# Patient Record
Sex: Female | Born: 1975 | Race: White | Hispanic: No | Marital: Married | Smoking: Former smoker
Health system: Southern US, Community
[De-identification: ages and names within clinical notes are randomized; demographics above are authoritative.]

## PROBLEM LIST (undated history)

## (undated) ENCOUNTER — Inpatient Hospital Stay (HOSPITAL_COMMUNITY): Payer: Self-pay

## (undated) HISTORY — PX: TONSILLECTOMY: SUR1361

---

## 1997-06-26 ENCOUNTER — Emergency Department (HOSPITAL_COMMUNITY): Admission: EM | Admit: 1997-06-26 | Discharge: 1997-06-26 | Payer: Self-pay | Admitting: Emergency Medicine

## 1997-08-31 ENCOUNTER — Emergency Department (HOSPITAL_COMMUNITY): Admission: EM | Admit: 1997-08-31 | Discharge: 1997-08-31 | Payer: Self-pay | Admitting: Emergency Medicine

## 2002-11-16 ENCOUNTER — Other Ambulatory Visit: Admission: RE | Admit: 2002-11-16 | Discharge: 2002-11-16 | Payer: Self-pay | Admitting: Obstetrics and Gynecology

## 2003-11-01 ENCOUNTER — Other Ambulatory Visit: Admission: RE | Admit: 2003-11-01 | Discharge: 2003-11-01 | Payer: Self-pay | Admitting: Obstetrics and Gynecology

## 2004-12-31 ENCOUNTER — Other Ambulatory Visit: Admission: RE | Admit: 2004-12-31 | Discharge: 2004-12-31 | Payer: Self-pay | Admitting: Obstetrics and Gynecology

## 2007-11-11 ENCOUNTER — Encounter: Admission: RE | Admit: 2007-11-11 | Discharge: 2007-11-11 | Payer: Self-pay | Admitting: Internal Medicine

## 2009-06-11 ENCOUNTER — Ambulatory Visit: Payer: Self-pay | Admitting: Physician Assistant

## 2009-06-11 ENCOUNTER — Inpatient Hospital Stay (HOSPITAL_COMMUNITY): Admission: AD | Admit: 2009-06-11 | Discharge: 2009-06-11 | Payer: Self-pay | Admitting: Obstetrics and Gynecology

## 2009-08-06 ENCOUNTER — Inpatient Hospital Stay (HOSPITAL_COMMUNITY): Admission: AD | Admit: 2009-08-06 | Discharge: 2009-08-08 | Payer: Self-pay | Admitting: *Deleted

## 2010-05-28 LAB — CBC
HCT: 32.7 % — ABNORMAL LOW (ref 36.0–46.0)
MCHC: 34.8 g/dL (ref 30.0–36.0)
MCV: 98.2 fL (ref 78.0–100.0)
Platelets: 197 10*3/uL (ref 150–400)
RDW: 12.7 % (ref 11.5–15.5)
RDW: 13.5 % (ref 11.5–15.5)
WBC: 16 10*3/uL — ABNORMAL HIGH (ref 4.0–10.5)
WBC: 17.2 10*3/uL — ABNORMAL HIGH (ref 4.0–10.5)

## 2010-05-28 LAB — RPR: RPR Ser Ql: NONREACTIVE

## 2010-05-30 LAB — URINALYSIS, ROUTINE W REFLEX MICROSCOPIC
Bilirubin Urine: NEGATIVE
Ketones, ur: NEGATIVE mg/dL
Nitrite: NEGATIVE
Specific Gravity, Urine: <1.005 — ABNORMAL LOW (ref 1.005–1.030)
Urobilinogen, UA: 0.2 mg/dL (ref 0.0–1.0)
pH: 5.5 (ref 5.0–8.0)

## 2010-05-30 LAB — URINE MICROSCOPIC-ADD ON

## 2011-02-04 LAB — OB RESULTS CONSOLE HEPATITIS B SURFACE ANTIGEN: Hepatitis B Surface Ag: NEGATIVE

## 2011-02-04 LAB — OB RESULTS CONSOLE GC/CHLAMYDIA: Chlamydia: NEGATIVE

## 2011-02-04 LAB — OB RESULTS CONSOLE RUBELLA ANTIBODY, IGM: Rubella: IMMUNE

## 2011-02-04 LAB — OB RESULTS CONSOLE RPR: RPR: NONREACTIVE

## 2011-02-04 LAB — OB RESULTS CONSOLE ABO/RH: RH Type: POSITIVE

## 2011-03-12 NOTE — L&D Delivery Note (Signed)
Pt continued to have decels and a low baseline with pushing. She brought the baby to +3 station. At that point the [facility] was in the 90's for 5 minutes. The VE was placed and the patient delivered one live viable white female infant over 2nd degree mid line tear in the ROP  Position. NICU was present. Placenta S/I. EBL-400cc.  Baby to NBN. Triple nuchal cord. Baby delivered with one push, no pop offs.

## 2011-08-05 ENCOUNTER — Inpatient Hospital Stay (HOSPITAL_COMMUNITY)
Admission: AD | Admit: 2011-08-05 | Discharge: 2011-08-05 | Disposition: A | Discharge status: Hospice | Payer: BC Managed Care – PPO | Source: Ambulatory Visit | Attending: Obstetrics and Gynecology | Admitting: Obstetrics and Gynecology

## 2011-08-05 ENCOUNTER — Encounter (HOSPITAL_COMMUNITY): Payer: Self-pay | Admitting: *Deleted

## 2011-08-05 ENCOUNTER — Inpatient Hospital Stay (HOSPITAL_COMMUNITY): Payer: BC Managed Care – PPO

## 2011-08-05 DIAGNOSIS — O368190 Decreased fetal movements, unspecified trimester, not applicable or unspecified: Secondary | ICD-10-CM | POA: Insufficient documentation

## 2011-08-05 NOTE — Discharge Instructions (Signed)

## 2011-08-05 NOTE — MAU Note (Signed)
Pt hasn't felt the baby moving as much today and she called the office and was told to come in.  Denies any pain, bleeding or leaking.

## 2011-08-05 NOTE — MAU Note (Signed)
A lot less movement this afternoon.denies pain or bleeding.

## 2011-08-05 NOTE — MAU Provider Note (Signed)
Krista Mcintosh is a 36 y.o. female @ [redacted]w[redacted]d gestation who presents to MAU for decreased fetal movement.   BP 126/66  Pulse 77  Resp 18  EFM Baseline 120, non reactive tracing.   I spoke with Dr. Dareen Piano and he request AFI and BPP.   RN to call with results.  Medical screening exam complete and patient stable to await further evaluation from Dr. Dareen Piano.

## 2011-08-11 ENCOUNTER — Inpatient Hospital Stay (HOSPITAL_COMMUNITY)
Admission: AD | Admit: 2011-08-11 | Discharge: 2011-08-13 | DRG: 373 | Disposition: A | Discharge status: Home / Self Care | Payer: BC Managed Care – PPO | Source: Ambulatory Visit | Attending: Obstetrics and Gynecology | Admitting: Obstetrics and Gynecology

## 2011-08-11 ENCOUNTER — Encounter (HOSPITAL_COMMUNITY): Payer: Self-pay | Admitting: Anesthesiology

## 2011-08-11 ENCOUNTER — Encounter (HOSPITAL_COMMUNITY): Payer: Self-pay | Admitting: *Deleted

## 2011-08-11 ENCOUNTER — Inpatient Hospital Stay (HOSPITAL_COMMUNITY): Payer: BC Managed Care – PPO | Admitting: Anesthesiology

## 2011-08-11 LAB — CBC
MCV: 94.3 fL (ref 78.0–100.0)
Platelets: 217 10*3/uL (ref 150–400)
RDW: 13.3 % (ref 11.5–15.5)
WBC: 23.3 10*3/uL — ABNORMAL HIGH (ref 4.0–10.5)

## 2011-08-11 MED ORDER — DIPHENHYDRAMINE HCL 50 MG/ML IJ SOLN: 12.5000 mg | INTRAMUSCULAR | Status: DC | PRN

## 2011-08-11 MED ORDER — CITRIC ACID-SODIUM CITRATE 334-500 MG/5ML PO SOLN: 30.0000 mL | ORAL | Status: DC | PRN

## 2011-08-11 MED ORDER — LIDOCAINE HCL (PF) 1 % IJ SOLN
INTRAMUSCULAR | Status: DC | PRN
  Administered 2011-08-11 (×2): 5 mL

## 2011-08-11 MED ORDER — LIDOCAINE HCL (PF) 1 % IJ SOLN
30.0000 mL | INTRAMUSCULAR | Status: DC | PRN
  Filled 2011-08-11: qty 30

## 2011-08-11 MED ORDER — PHENYLEPHRINE 40 MCG/ML (10ML) SYRINGE FOR IV PUSH (FOR BLOOD PRESSURE SUPPORT)
80.0000 ug | PREFILLED_SYRINGE | INTRAVENOUS | Status: DC | PRN
  Filled 2011-08-11: qty 5

## 2011-08-11 MED ORDER — IBUPROFEN 600 MG PO TABS: 600.0000 mg | ORAL_TABLET | Freq: Four times a day (QID) | ORAL | Status: DC | PRN

## 2011-08-11 MED ORDER — ACETAMINOPHEN 325 MG PO TABS: 650.0000 mg | ORAL_TABLET | ORAL | Status: DC | PRN

## 2011-08-11 MED ORDER — EPHEDRINE 5 MG/ML INJ: 10.0000 mg | INTRAVENOUS | Status: DC | PRN

## 2011-08-11 MED ORDER — FENTANYL 2.5 MCG/ML BUPIVACAINE 1/10 % EPIDURAL INFUSION (WH - ANES)
14.0000 mL/h | INTRAMUSCULAR | Status: DC
  Administered 2011-08-11: 14 mL/h via EPIDURAL
  Filled 2011-08-11: qty 60

## 2011-08-11 MED ORDER — LACTATED RINGERS IV SOLN: 500.0000 mL | Freq: Once | INTRAVENOUS | Status: DC

## 2011-08-11 MED ORDER — LACTATED RINGERS IV SOLN
500.0000 mL | INTRAVENOUS | Status: DC | PRN
  Administered 2011-08-11 – 2011-08-12 (×2): 1000 mL via INTRAVENOUS

## 2011-08-11 MED ORDER — FLEET ENEMA 7-19 GM/118ML RE ENEM: 1.0000 | ENEMA | RECTAL | Status: DC | PRN

## 2011-08-11 MED ORDER — ONDANSETRON HCL 4 MG/2ML IJ SOLN: 4.0000 mg | Freq: Four times a day (QID) | INTRAMUSCULAR | Status: DC | PRN

## 2011-08-11 MED ORDER — OXYTOCIN 20 UNITS IN LACTATED RINGERS INFUSION - SIMPLE
125.0000 mL/h | Freq: Once | INTRAVENOUS | Status: AC
  Administered 2011-08-12: 125 mL/h via INTRAVENOUS

## 2011-08-11 MED ORDER — OXYTOCIN BOLUS FROM INFUSION
500.0000 mL | Freq: Once | INTRAVENOUS | Status: DC
  Filled 2011-08-11: qty 1000
  Filled 2011-08-11: qty 500

## 2011-08-11 MED ORDER — LACTATED RINGERS IV SOLN
INTRAVENOUS | Status: DC
  Administered 2011-08-11: 22:00:00 via INTRAVENOUS

## 2011-08-11 MED ORDER — EPHEDRINE 5 MG/ML INJ
10.0000 mg | INTRAVENOUS | Status: DC | PRN
  Filled 2011-08-11: qty 4

## 2011-08-11 MED ORDER — PHENYLEPHRINE 40 MCG/ML (10ML) SYRINGE FOR IV PUSH (FOR BLOOD PRESSURE SUPPORT): 80.0000 ug | PREFILLED_SYRINGE | INTRAVENOUS | Status: DC | PRN

## 2011-08-11 MED ORDER — OXYCODONE-ACETAMINOPHEN 5-325 MG PO TABS: 1.0000 | ORAL_TABLET | ORAL | Status: DC | PRN

## 2011-08-11 NOTE — Progress Notes (Signed)
RN placed pulse ox. Pt removed.

## 2011-08-11 NOTE — Progress Notes (Signed)
Pt has not voided since 2100. I&O cath 100cc of concentrated urine

## 2011-08-11 NOTE — Progress Notes (Signed)
Rn holding cardio and assessing FHR. Difficult to determine. RN continues to assess.

## 2011-08-11 NOTE — H&P (Signed)
Pt is a 36 year old white female G2P1001 at term who presents via EMS.  Pt was at home attempting a home birth when decels were noted in the second stage. After arrival pt was checked. She was C/C/+1, LOT.  The FHTs were reactive with variable decels. PNC was uncomplicated. She recently had an ultrasound for EFW. The EFW was 8-2. After arrival the patient had an epidural placed.  PMHx: see hollister\ PE: VSSAF         ABD-gravid, non tender IMP/ IUP at term in labor         Variable decels Plan/ Admit          Will begin pushing

## 2011-08-11 NOTE — Anesthesia Preprocedure Evaluation (Signed)
Anesthesia Evaluation  Patient identified by MRN, date of birth, ID band Patient awake    Reviewed: Allergy & Precautions, H&P , Patient's Chart, lab work & pertinent test results  Airway Mallampati: II TM Distance: >3 FB Neck ROM: full    Dental No notable dental hx.    Pulmonary neg pulmonary ROS, asthma ,  breath sounds clear to auscultation  Pulmonary exam normal       Cardiovascular negative cardio ROS  Rhythm:regular Rate:Normal     Neuro/Psych negative neurological ROS  negative psych ROS   GI/Hepatic negative GI ROS, Neg liver ROS,   Endo/Other  negative endocrine ROS  Renal/GU negative Renal ROS     Musculoskeletal   Abdominal   Peds  Hematology negative hematology ROS (+)   Anesthesia Other Findings   Reproductive/Obstetrics (+) Pregnancy                           Anesthesia Physical Anesthesia Plan  ASA: II  Anesthesia Plan: Epidural   Post-op Pain Management:    Induction:   Airway Management Planned:   Additional Equipment:   Intra-op Plan:   Post-operative Plan:   Informed Consent: I have reviewed the patients History and Physical, chart, labs and discussed the procedure including the risks, benefits and alternatives for the proposed anesthesia with the patient or authorized representative who has indicated his/her understanding and acceptance.     Plan Discussed with:   Anesthesia Plan Comments:         Anesthesia Quick Evaluation  

## 2011-08-11 NOTE — Anesthesia Procedure Notes (Signed)
Epidural Patient location during procedure: OB Start time: 08/11/2011 10:50 PM  Staffing Anesthesiologist: Brayton Caves R Performed by: anesthesiologist   Preanesthetic Checklist Completed: patient identified, site marked, surgical consent, pre-op evaluation, timeout performed, IV checked, risks and benefits discussed and monitors and equipment checked  Epidural Patient position: sitting Prep: site prepped and draped and DuraPrep Patient monitoring: continuous pulse ox and blood pressure Approach: midline Injection technique: LOR air and LOR saline  Needle:  Needle type: Tuohy  Needle gauge: 17 G Needle length: 9 cm Needle insertion depth: 5 cm cm Catheter type: closed end flexible Catheter size: 19 Gauge Catheter at skin depth: 10 cm Test dose: negative  Assessment Events: blood not aspirated, injection not painful, no injection resistance, negative IV test and no paresthesia  Additional Notes Patient identified.  Risk benefits discussed including failed block, incomplete pain control, headache, nerve damage, paralysis, blood pressure changes, nausea, vomiting, reactions to medication both toxic or allergic, and postpartum back pain.  Patient expressed understanding and wished to proceed.  All questions were answered.  Sterile technique used throughout procedure and epidural site dressed with sterile barrier dressing. No paresthesia or other complications noted.The patient did not experience any signs of intravascular injection such as tinnitus or metallic taste in mouth nor signs of intrathecal spread such as rapid motor block. Please see nursing notes for vital signs.

## 2011-08-12 ENCOUNTER — Encounter (HOSPITAL_COMMUNITY): Payer: Self-pay | Admitting: *Deleted

## 2011-08-12 LAB — RPR: RPR Ser Ql: NONREACTIVE

## 2011-08-12 MED ORDER — ZOLPIDEM TARTRATE 5 MG PO TABS: 5.0000 mg | ORAL_TABLET | Freq: Every evening | ORAL | Status: DC | PRN

## 2011-08-12 MED ORDER — PRENATAL MULTIVITAMIN CH
1.0000 | ORAL_TABLET | Freq: Every day | ORAL | Status: DC
  Administered 2011-08-12 – 2011-08-13 (×2): 1 via ORAL
  Filled 2011-08-12 (×2): qty 1

## 2011-08-12 MED ORDER — TETANUS-DIPHTH-ACELL PERTUSSIS 5-2.5-18.5 LF-MCG/0.5 IM SUSP: 0.5000 mL | Freq: Once | INTRAMUSCULAR | Status: DC

## 2011-08-12 MED ORDER — WITCH HAZEL-GLYCERIN EX PADS
1.0000 "application " | MEDICATED_PAD | CUTANEOUS | Status: DC | PRN
  Administered 2011-08-12: 1 via TOPICAL

## 2011-08-12 MED ORDER — MEASLES, MUMPS & RUBELLA VAC ~~LOC~~ INJ: 0.5000 mL | INJECTION | Freq: Once | SUBCUTANEOUS | Status: DC

## 2011-08-12 MED ORDER — IBUPROFEN 600 MG PO TABS
600.0000 mg | ORAL_TABLET | Freq: Four times a day (QID) | ORAL | Status: DC
  Administered 2011-08-12 – 2011-08-13 (×5): 600 mg via ORAL
  Filled 2011-08-12 (×6): qty 1

## 2011-08-12 MED ORDER — ONDANSETRON HCL 4 MG PO TABS: 4.0000 mg | ORAL_TABLET | ORAL | Status: DC | PRN

## 2011-08-12 MED ORDER — OXYCODONE-ACETAMINOPHEN 5-325 MG PO TABS: 1.0000 | ORAL_TABLET | ORAL | Status: DC | PRN

## 2011-08-12 MED ORDER — DIBUCAINE 1 % RE OINT: 1.0000 "application " | TOPICAL_OINTMENT | RECTAL | Status: DC | PRN

## 2011-08-12 MED ORDER — SIMETHICONE 80 MG PO CHEW: 80.0000 mg | CHEWABLE_TABLET | ORAL | Status: DC | PRN

## 2011-08-12 MED ORDER — BENZOCAINE-MENTHOL 20-0.5 % EX AERO
1.0000 "application " | INHALATION_SPRAY | CUTANEOUS | Status: DC | PRN
  Administered 2011-08-12: 1 via TOPICAL
  Filled 2011-08-12: qty 56

## 2011-08-12 MED ORDER — ONDANSETRON HCL 4 MG/2ML IJ SOLN: 4.0000 mg | INTRAMUSCULAR | Status: DC | PRN

## 2011-08-12 NOTE — Progress Notes (Signed)
RROB RN and Chief Technology Officer continue to stay at the bedside assessing for FHR. Pt moving all over with UC's Difficult to assess.

## 2011-08-12 NOTE — Progress Notes (Signed)
Post Partum Day 0 Subjective: no complaints, up ad lib, voiding and tolerating PO  Objective: Filed Vitals:   08/12/11 0216 08/12/11 0250 08/12/11 0347 08/12/11 0755  BP: 97/71 121/72 101/63 100/65  Pulse: 109 99 93 85  Temp:  98.1 F (36.7 C) 98.9 F (37.2 C) 98.4 F (36.9 C)  TempSrc:  Oral Oral Oral  Resp:  18 18 18   Height:      Weight:      SpO2:  98% 98% 98%    Physical Exam:  General: alert, cooperative and appears stated age Lochia: appropriate Uterine Fundus: firm   Basename 08/11/11 2217  HGB 13.3  HCT 38.3    Assessment/Plan: Routine PP care Pt declines neonatal circ   LOS: 1 day   Charonda Hefter H. 08/12/2011, 8:45 AM

## 2011-08-12 NOTE — Progress Notes (Signed)
Orders to begin pushing

## 2011-08-12 NOTE — Progress Notes (Signed)
Called dr Dareen Piano informed of pt arrival, sve, requesting admit orders. Orders received. To call him when needed for delivery.

## 2011-08-12 NOTE — Progress Notes (Signed)
MD at bedside discussing with pt need for vacuum assisted delivery due to FHR. Pt ask for one more try pushing before  Vacuum. MD allows.

## 2011-08-12 NOTE — Progress Notes (Signed)
Soliz RN called Dareen Piano before arrival to let MD know of pts pending arrival. MD aware and said call when needed for delivery

## 2011-08-12 NOTE — Progress Notes (Signed)
Patient refused 1200 Motrin, said she would call if needed.

## 2011-08-12 NOTE — Progress Notes (Signed)
Pt turned on right side- Anesthesia still in room- RN placing continuing to assess FHR. Unable to detect. Pulse OX reapplied- not detecting during epidural.

## 2011-08-12 NOTE — Anesthesia Postprocedure Evaluation (Signed)
  Anesthesia Post Note  Patient: Krista Mcintosh  Procedure(s) Performed: * No procedures listed *  Anesthesia type: Epidural  Patient location: Mother/Baby  Post pain: Pain level controlled  Post assessment: Post-op Vital signs reviewed  Last Vitals:  Filed Vitals:   08/12/11 0755  BP: 100/65  Pulse: 85  Temp: 36.9 C  Resp: 18    Post vital signs: Reviewed  Level of consciousness:alert  Complications: No apparent anesthesia complications

## 2011-08-12 NOTE — Progress Notes (Signed)
Called Dr. Dareen Piano and updated on pts status. Explained to MD pt here via EMS- Difficulty tracing FHR- pt has epidural now and FHR tracing with IFSE. Baseline 130 with mod variability, variable decels with every UC and some down to 60's. On exam pt complete with caput felt- unable to determine position. Explained pt was starting to feel relief. RN advised pt to not push and allow rest for FHR.  Asked MD if he wanted RN to continue with current interventions and positioning for descent of baby. No new orders received.

## 2011-08-12 NOTE — Progress Notes (Signed)
Cardio still applied and assessing during epidural

## 2011-08-13 LAB — CBC
Hemoglobin: 9.6 g/dL — ABNORMAL LOW (ref 12.0–15.0)
MCH: 32.1 pg (ref 26.0–34.0)
Platelets: 187 10*3/uL (ref 150–400)
RBC: 2.99 MIL/uL — ABNORMAL LOW (ref 3.87–5.11)
WBC: 10.7 10*3/uL — ABNORMAL HIGH (ref 4.0–10.5)

## 2011-08-13 NOTE — Progress Notes (Signed)
Patient is eating, ambulating, voiding.  Pain control is good.  Filed Vitals:   08/12/11 1413 08/12/11 1626 08/12/11 2023 08/13/11 0635  BP: 129/84 112/69 104/62 105/68  Pulse: 84 78 75 66  Temp: 98.6 F (37 C) 98.2 F (36.8 C) 97.9 F (36.6 C) 98.2 F (36.8 C)  TempSrc: Oral Oral Oral Oral  Resp: 18 18 18 18   Height:      Weight:      SpO2:        Fundus firm Perineum without swelling.  Lab Results  Component Value Date   WBC 10.7* 08/13/2011   HGB 9.6* 08/13/2011   HCT 29.2* 08/13/2011   MCV 97.7 08/13/2011   PLT 187 08/13/2011    O/Positive/-- (11/26 0000)/RI  A/P Post partum day 1.  Routine care.  Expect d/c today.   No circ. Laketha Leopard A

## 2011-08-13 NOTE — Discharge Summary (Signed)
Obstetric Discharge Summary Reason for Admission: onset of labor Prenatal Procedures: none Intrapartum Procedures: vacuum Postpartum Procedures: none Complications-Operative and Postpartum: 2 degree perineal laceration Hemoglobin  Date Value Range Status  08/13/2011 9.6* 12.0-15.0 (g/dL) Final     DELTA CHECK NOTED     REPEATED TO VERIFY     HCT  Date Value Range Status  08/13/2011 29.2* 36.0-46.0 (%) Final    Discharge Diagnoses: Term Pregnancy-delivered  Discharge Information: Date: 08/13/2011 Activity: pelvic rest Diet: routine Medications: Ibuprofen Condition: stable Instructions: refer to practice specific booklet Discharge to: home Follow-up Information    Follow up with Alvie Fowles A, MD. Schedule an appointment as soon as possible for a visit in 4 weeks.   Contact information:   [address] [place] [address] [phone]          Newborn Data: Live born female  Birth Weight: 8 lb 6.4 oz (3810 g) APGAR: 9, 9  Home with mother.  Jobeth Pangilinan A 08/13/2011, 8:48 AM

## 2014-01-10 ENCOUNTER — Encounter (HOSPITAL_COMMUNITY): Payer: Self-pay | Admitting: *Deleted

## 2014-03-17 ENCOUNTER — Encounter (HOSPITAL_COMMUNITY): Payer: Self-pay | Admitting: *Deleted

## 2014-03-17 ENCOUNTER — Inpatient Hospital Stay (HOSPITAL_COMMUNITY)
Admission: AD | Admit: 2014-03-17 | Discharge: 2014-03-17 | Disposition: A | Discharge status: Home / Self Care | Payer: BC Managed Care – PPO | Source: Ambulatory Visit | Attending: Obstetrics & Gynecology | Admitting: Obstetrics & Gynecology

## 2014-03-17 DIAGNOSIS — Z823 Family history of stroke: Secondary | ICD-10-CM | POA: Insufficient documentation

## 2014-03-17 DIAGNOSIS — Z3A29 29 weeks gestation of pregnancy: Secondary | ICD-10-CM | POA: Insufficient documentation

## 2014-03-17 DIAGNOSIS — O9989 Other specified diseases and conditions complicating pregnancy, childbirth and the puerperium: Secondary | ICD-10-CM | POA: Diagnosis not present

## 2014-03-17 DIAGNOSIS — Z87891 Personal history of nicotine dependence: Secondary | ICD-10-CM | POA: Diagnosis not present

## 2014-03-17 DIAGNOSIS — R05 Cough: Secondary | ICD-10-CM | POA: Insufficient documentation

## 2014-03-17 LAB — URINALYSIS, ROUTINE W REFLEX MICROSCOPIC
Bilirubin Urine: NEGATIVE
Glucose, UA: NEGATIVE mg/dL
Hgb urine dipstick: NEGATIVE
KETONES UR: NEGATIVE mg/dL
LEUKOCYTES UA: NEGATIVE
NITRITE: NEGATIVE
PROTEIN: NEGATIVE mg/dL
SPECIFIC GRAVITY, URINE: 1.01 (ref 1.005–1.030)
UROBILINOGEN UA: 0.2 mg/dL (ref 0.0–1.0)
pH: 6.5 (ref 5.0–8.0)

## 2014-03-17 MED ORDER — GUAIFENESIN-CODEINE 100-10 MG/5ML PO SOLN: 5.0000 mL | Freq: Every evening | ORAL | Status: DC | PRN

## 2014-03-17 MED ORDER — BENZONATATE 100 MG PO CAPS: 100.0000 mg | ORAL_CAPSULE | Freq: Three times a day (TID) | ORAL | Status: DC | PRN

## 2014-03-17 NOTE — MAU Note (Signed)
Cough started in October, still coughing.  Had a cold recently but is better now.  Has seen PCP for cough & cold.  Ribs are very sore from coughing, cough has become worse in the last week., unable to take deep breath.  Planning home birth. Using albuterol inhaler sparingly.  Cough is non-productive, dry.

## 2014-03-17 NOTE — Discharge Instructions (Signed)
Cough, Adult  A cough is a reflex that helps clear your throat and airways. It can help heal the body or may be a reaction to an irritated airway. A cough may only last 2 or 3 weeks (acute) or may last more than 8 weeks (chronic).  CAUSES Acute cough:  Viral or bacterial infections. Chronic cough:  Infections.  Allergies.  Asthma.  Post-nasal drip.  Smoking.  Heartburn or acid reflux.  Some medicines.  Chronic lung problems (COPD).  Cancer. SYMPTOMS   Cough.  Fever.  Chest pain.  Increased breathing rate.  High-pitched whistling sound when breathing (wheezing).  Colored mucus that you cough up (sputum). TREATMENT   A bacterial cough may be treated with antibiotic medicine.  A viral cough must run its course and will not respond to antibiotics.  Your caregiver may recommend other treatments if you have a chronic cough. HOME CARE INSTRUCTIONS   Only take over-the-counter or prescription medicines for pain, discomfort, or fever as directed by your caregiver. Use cough suppressants only as directed by your caregiver.  Use a cold steam vaporizer or humidifier in your bedroom or home to help loosen secretions.  Sleep in a semi-upright position if your cough is worse at night.  Rest as needed.  Stop smoking if you smoke. SEEK IMMEDIATE MEDICAL CARE IF:   You have pus in your sputum.  Your cough starts to worsen.  You cannot control your cough with suppressants and are losing sleep.  You begin coughing up blood.  You have difficulty breathing.  You develop pain which is getting worse or is uncontrolled with medicine.  You have a fever. MAKE SURE YOU:   Understand these instructions.  Will watch your condition.  Will get help right away if you are not doing well or get worse. Document Released: 08/24/2010 Document Revised: 05/20/2011 Document Reviewed: 08/24/2010 Exit[employer] Patient Information 2015 [employer]. This information is not intended  to replace advice given to you by your health care provider. Make sure you discuss any questions you have with your health care provider.  

## 2014-03-17 NOTE — MAU Provider Note (Signed)
Chief Complaint:  Cough   First Provider Initiated Contact with Patient 03/17/14 1853      HPI: Krista Mcintosh is a 39 y.o. G3P2002 at [redacted]w[redacted]d who presents to maternity admissions reporting persistent dry cough since October. Cough is now dry in nature. Nonproductive. Congestion was present initially but now is not. Patient states she has seen her PCP x2 for this and her midwife once. She has taken mucinex and was prescribed an albuterol inhaler by her PCP. Patient reports cough is worse at night.  She also reports a loss in the sense of smell over the past few weeks w/ associated diminished appetite. Denies fevers, chills, n/v  Denies contractions, leakage of fluid or vaginal bleeding. Good fetal movement. No pregnancy issues to report at this time.  Hx of smoking (10 pack-year) quit 10 years ago. And Hx of Asthma.  Pregnancy Course:   Past Medical History: Past Medical History  Diagnosis Date  . Asthma     Past obstetric history: OB History  Gravida Para Term Preterm AB SAB TAB Ectopic Multiple Living  0 0 0 0 0 0 2    # Outcome Date GA Lbr Len/2nd Weight Sex Delivery Anes PTL Lv  3 Current           2 Term 08/12/11 [redacted]w[redacted]d 16:35 / 03:13 3.81 kg (8 lb 6.4 oz) M Vag-Vacuum EPI  Y  1 Term               Past Surgical History: Past Surgical History  Procedure Laterality Date  . Tonsillectomy       Family History: Family History  Problem Relation Age of Onset  . Asthma Father   . Diabetes Brother   . Hyperlipidemia Brother   . Heart disease Maternal Grandmother   . Stroke Maternal Grandmother   . Hypertension Maternal Grandmother   . Diabetes Maternal Grandmother   . Heart disease Paternal Grandmother     Social History: History  Substance Use Topics  . Smoking status: Former Smoker -- 1.00 packs/day for 10 years    Types: Cigarettes    Quit date: 08/05/2003  . Smokeless tobacco: Not on file  . Alcohol Use: No     Comment: none while pregnant     Allergies: No Known Allergies  Meds:  Prescriptions prior to admission  Medication Sig Dispense Refill Last Dose  . albuterol (PROVENTIL HFA;VENTOLIN HFA) 108 (90 BASE) MCG/ACT inhaler Inhale 1-2 puffs into the lungs every 6 (six) hours as needed for wheezing or shortness of breath.   03/16/2014 at Unknown time  . cetirizine (ZYRTEC) 10 MG tablet Take 10 mg by mouth daily.   03/17/2014 at Unknown time  . Cholecalciferol (VITAMIN D PO) Take 1 tablet by mouth daily.   03/17/2014 at Unknown time  . fish oil-omega-3 fatty acids 1000 MG capsule Take 1 g by mouth daily.   03/17/2014 at Unknown time  . fluticasone (FLONASE) 50 MCG/ACT nasal spray Place 2 sprays into the nose daily.   03/17/2014 at Unknown time  . Phenylephrine-APAP-Guaifenesin (MUCINEX FAST-MAX COLD & SINUS PO) Take 20 mLs by mouth once.   03/16/2014 at Unknown time  . Prenatal Vit-Fe Fumarate-FA (PRENATAL MULTIVITAMIN) TABS Take 1 tablet by mouth daily.   03/17/2014 at Unknown time  . Probiotic Product (PROBIOTIC PO) Take 1 tablet by mouth daily.   03/17/2014 at Unknown time    ROS: Pertinent findings in history of present illness.  Physical Exam  Blood pressure  112/56, pulse 70, temperature 97.8 F (36.6 C), temperature source Oral, resp. rate 18, height 5\' 6"  (1.676 m), weight 77.565 kg (171 lb), SpO2 99 %, unknown if currently breastfeeding. GENERAL: Well-developed, well-nourished female in no acute distress.  HEENT: normocephalic HEART: normal rate RESP: normal effort ABDOMEN: Soft, non-tender, gravid appropriate for gestational age EXTREMITIES: Nontender, no edema NEURO: alert and oriented SPECULUM EXAM: NEFG, physiologic discharge, no blood, cervix clean    FHT:  Baseline 140, moderate variability. No contractions   Labs: Results for orders placed or performed during the hospital encounter of 03/17/14 (from the past 24 hour(s))  Urinalysis, Routine w reflex microscopic     Status: None   Collection Time: 03/17/14  6:30 PM   Result Value Ref Range   Color, Urine YELLOW YELLOW   APPearance CLEAR CLEAR   Specific Gravity, Urine 1.010 1.005 - 1.030   pH 6.5 5.0 - 8.0   Glucose, UA NEGATIVE NEGATIVE mg/dL   Hgb urine dipstick NEGATIVE NEGATIVE   Bilirubin Urine NEGATIVE NEGATIVE   Ketones, ur NEGATIVE NEGATIVE mg/dL   Protein, ur NEGATIVE NEGATIVE mg/dL   Urobilinogen, UA 0.2 0.0 - 1.0 mg/dL   Nitrite NEGATIVE NEGATIVE   Leukocytes, UA NEGATIVE NEGATIVE    Imaging:  No results found. MAU Course: Patient likely suffering from viral superinfection vs. B. Pertussis vs. Asthma. Lung fields clear. No fevers. Persistent cough x 3 months. Pertussis PCR has been collected prior to DC.  No concerning evidence involving pregnancy.  Assessment: No diagnosis found.  Plan: - Tessalon perles TID x 3 days - Guaifensin codeine cough syrup nightly PRN - Pertussis PCR pending at DC - Continue Albuterol PRN throughout day - Strong consideration for getting humidifier for home - Discharge home - Labor precautions and fetal kick counts       Follow-up Information    Schedule an appointment as soon as possible for a visit in 1 week to follow up.       Medication List    TAKE these medications        albuterol 108 (90 BASE) MCG/ACT inhaler  Commonly known as:  PROVENTIL HFA;VENTOLIN HFA  Inhale 1-2 puffs into the lungs every 6 (six) hours as needed for wheezing or shortness of breath.     benzonatate 100 MG capsule  Commonly known as:  TESSALON PERLES  Take 1 capsule (100 mg total) by mouth 3 (three) times daily as needed for cough.     cetirizine 10 MG tablet  Commonly known as:  ZYRTEC  Take 10 mg by mouth daily.     fish oil-omega-3 fatty acids 1000 MG capsule  Take 1 g by mouth daily.     fluticasone 50 MCG/ACT nasal spray  Commonly known as:  FLONASE  Place 2 sprays into the nose daily.     guaiFENesin-codeine 100-10 MG/5ML syrup  Take 5 mLs by mouth at bedtime as needed and may repeat dose  one time if needed for cough.     MUCINEX FAST-MAX COLD & SINUS PO  Take 20 mLs by mouth once.     prenatal multivitamin Tabs tablet  Take 1 tablet by mouth daily.     PROBIOTIC PO  Take 1 tablet by mouth daily.     VITAMIN D PO  Take 1 tablet by mouth daily.        Kathee DeltonIan D McKeag, MD 03/17/2014 8:01 PM   I have seen and examined this patient and agree the above assessment. CRESENZO-DISHMAN,Yeng Frankie 03/19/2014  2:32 PM   Wrong culture swab used for pertussis screening.  Pt did not want to come back to MAU, and stated that she would get her midwife to collect and order it.

## 2014-03-26 ENCOUNTER — Inpatient Hospital Stay (HOSPITAL_COMMUNITY)
Admission: AD | Admit: 2014-03-26 | Discharge: 2014-03-26 | Disposition: A | Discharge status: Home / Self Care | Payer: BC Managed Care – PPO | Source: Ambulatory Visit | Attending: Obstetrics & Gynecology | Admitting: Obstetrics & Gynecology

## 2014-03-26 ENCOUNTER — Encounter (HOSPITAL_COMMUNITY): Payer: Self-pay | Admitting: *Deleted

## 2014-03-26 DIAGNOSIS — Z3A30 30 weeks gestation of pregnancy: Secondary | ICD-10-CM | POA: Diagnosis not present

## 2014-03-26 DIAGNOSIS — O26893 Other specified pregnancy related conditions, third trimester: Secondary | ICD-10-CM | POA: Insufficient documentation

## 2014-03-26 DIAGNOSIS — J45909 Unspecified asthma, uncomplicated: Secondary | ICD-10-CM | POA: Diagnosis not present

## 2014-03-26 DIAGNOSIS — R05 Cough: Secondary | ICD-10-CM | POA: Diagnosis not present

## 2014-03-26 DIAGNOSIS — R053 Chronic cough: Secondary | ICD-10-CM

## 2014-03-26 DIAGNOSIS — O9989 Other specified diseases and conditions complicating pregnancy, childbirth and the puerperium: Secondary | ICD-10-CM | POA: Diagnosis not present

## 2014-03-26 DIAGNOSIS — Z87891 Personal history of nicotine dependence: Secondary | ICD-10-CM | POA: Insufficient documentation

## 2014-03-26 LAB — URINALYSIS, ROUTINE W REFLEX MICROSCOPIC
BILIRUBIN URINE: NEGATIVE
Glucose, UA: NEGATIVE mg/dL
Hgb urine dipstick: NEGATIVE
KETONES UR: NEGATIVE mg/dL
Leukocytes, UA: NEGATIVE
NITRITE: NEGATIVE
PH: 6.5 (ref 5.0–8.0)
PROTEIN: NEGATIVE mg/dL
SPECIFIC GRAVITY, URINE: 1.01 (ref 1.005–1.030)
Urobilinogen, UA: 0.2 mg/dL (ref 0.0–1.0)

## 2014-03-26 MED ORDER — FAMOTIDINE 20 MG PO TABS: 20.0000 mg | ORAL_TABLET | Freq: Every day | ORAL | Status: DC

## 2014-03-26 MED ORDER — BECLOMETHASONE DIPROPIONATE 80 MCG/ACT IN AERS: 2.0000 | INHALATION_SPRAY | Freq: Two times a day (BID) | RESPIRATORY_TRACT | Status: DC

## 2014-03-26 NOTE — MAU Note (Signed)
Patient presents at 30.6 weeks with complaint of a persistent cough that she's had since October. States she has been seen and treated with codeine but cough continues. Denies bleeding or discharge and fetus is active.

## 2014-03-26 NOTE — MAU Provider Note (Signed)
CSN: [id number]     Arrival date & time 03/26/14  1036 History   None    Chief Complaint  Patient presents with  . Cough     (Consider location/radiation/quality/duration/timing/severity/associated sxs/prior Treatment) HPI  Krista Mcintosh is a 39 y.o. G3P2002 at [redacted]w[redacted]d. She presents again with presistent cough. She has had a cough since October. She has asthma, using her rescue inhaler 2x/d, taking Zyrtec, Flonase, hx Rx Tessalon Perles- nothing makes the cough stop. She is getting her [facility] with a CNM near [employer] and plans a home birth. She has seen her PCP for this also.   Past Medical History  Diagnosis Date  . Asthma    Past Surgical History  Procedure Laterality Date  . Tonsillectomy     Family History  Problem Relation Age of Onset  . Asthma Father   . Diabetes Brother   . Hyperlipidemia Brother   . Heart disease Maternal Grandmother   . Stroke Maternal Grandmother   . Hypertension Maternal Grandmother   . Diabetes Maternal Grandmother   . Heart disease Paternal Grandmother    History  Substance Use Topics  . Smoking status: Former Smoker -- 1.00 packs/day for 10 years    Types: Cigarettes    Quit date: 08/05/2003  . Smokeless tobacco: Never Used  . Alcohol Use: No     Comment: none while pregnant   OB History    Gravida Para Term Preterm AB TAB SAB Ectopic Multiple Living   0 0 0 0 0 0 2     Review of Systems  HENT: Negative for congestion, ear pain and sinus pressure.   Respiratory: Positive for cough. Negative for choking, shortness of breath and wheezing.   Cardiovascular: Negative for chest pain.      Allergies  Review of patient's allergies indicates no known allergies.  Home Medications   Prior to Admission medications   Medication Sig Start Date End Date Taking? Authorizing Provider  acetaminophen (TYLENOL) 500 MG tablet Take 1,000 mg by mouth every 6 (six) hours as needed for mild pain.   Yes Historical Provider, MD  albuterol  (PROVENTIL HFA;VENTOLIN HFA) 108 (90 BASE) MCG/ACT inhaler Inhale 1-2 puffs into the lungs every 6 (six) hours as needed for wheezing or shortness of breath.   Yes Historical Provider, MD  cetirizine (ZYRTEC) 10 MG tablet Take 10 mg by mouth daily.   Yes Historical Provider, MD  Cholecalciferol (VITAMIN D PO) Take 1 tablet by mouth daily.   Yes Historical Provider, MD  DiphenhydrAMINE HCl (BENADRYL PO) Take 2 tablets by mouth at bedtime as needed (allergies or sleep).   Yes Historical Provider, MD  fish oil-omega-3 fatty acids 1000 MG capsule Take 1 g by mouth daily.   Yes Historical Provider, MD  fluticasone (FLONASE) 50 MCG/ACT nasal spray Place 2 sprays into the nose daily.   Yes Historical Provider, MD  guaiFENesin-codeine 100-10 MG/5ML syrup Take 5 mLs by mouth at bedtime as needed and may repeat dose one time if needed for cough. 03/17/14  Yes Kathee Delton, MD  IRON PO Take 1 tablet by mouth daily.   Yes Historical Provider, MD  Prenatal Vit-Fe Fumarate-FA (PRENATAL MULTIVITAMIN) TABS Take 1 tablet by mouth daily.   Yes Historical Provider, MD  Probiotic Product (PROBIOTIC PO) Take 1 tablet by mouth daily.   Yes Historical Provider, MD  benzonatate (TESSALON PERLES) 100 MG capsule Take 1 capsule (100 mg total) by mouth 3 (three) times daily as  needed for cough. Patient not taking: Reported on 03/26/2014 03/17/14   Kathee DeltonIan D McKeag, MD   BP 124/61 mmHg  Pulse 65  Temp(Src) 97.5 F (36.4 C) (Oral)  Resp 16  Ht 5\' 5"  (1.651 m)  Wt 78.472 kg (173 lb)  BMI 28.79 kg/m2  SpO2 97% Physical Exam  Constitutional: She is oriented to person, place, and time. She appears well-developed and well-nourished.  Pulmonary/Chest: Effort normal. No respiratory distress. She has no wheezes. She has no rales.  Abdominal: Soft.  Musculoskeletal: Normal range of motion.  Neurological: She is alert and oriented to person, place, and time.  Skin: Skin is warm and dry.  Psychiatric: She has a normal mood and affect.  Her behavior is normal.    ED Course  Procedures (including critical care time) Labs Review Labs Reviewed  URINALYSIS, ROUTINE W REFLEX MICROSCOPIC    Imaging Review No results found.   EKG Interpretation None      MDM  She is known asthmatic, using her rescue inhaler 2x/d. Will start her on inhaled corticosteriod, and Pepcid to see if we can resolve her symptoms. Final diagnoses:  None   A- 30 6/[redacted] wks EGA, reactive tracing Asthmatic with persistent cough  Rx Q-var Pepcid 20 mg/d Consulted with Dr Debroah LoopArnold

## 2014-04-18 ENCOUNTER — Ambulatory Visit (INDEPENDENT_AMBULATORY_CARE_PROVIDER_SITE_OTHER): Payer: BC Managed Care – PPO | Admitting: Internal Medicine

## 2014-04-18 ENCOUNTER — Encounter (INDEPENDENT_AMBULATORY_CARE_PROVIDER_SITE_OTHER): Payer: Self-pay

## 2014-04-18 ENCOUNTER — Encounter: Payer: Self-pay | Admitting: Internal Medicine

## 2014-04-18 VITALS — BP 106/70 | HR 76 | Ht 66.0 in | Wt 176.0 lb

## 2014-04-18 DIAGNOSIS — R053 Chronic cough: Secondary | ICD-10-CM

## 2014-04-18 DIAGNOSIS — R05 Cough: Secondary | ICD-10-CM

## 2014-04-18 MED ORDER — BECLOMETHASONE DIPROPIONATE 40 MCG/ACT IN AERS: INHALATION_SPRAY | RESPIRATORY_TRACT | Status: DC

## 2014-04-18 NOTE — Patient Instructions (Signed)
Stop fish oil   Qvar 40 Take 2 puffs first thing in am and then another 2 puffs about 12 hours later.   Pepcid 20 mg after bfast and after supper until no longer coughing for at least a week   GERD (REFLUX)  is an extremely common cause of respiratory symptoms just like yours , many times with no obvious heartburn at all.    It can be treated with medication, but also with lifestyle changes including avoidance of late meals, excessive alcohol, smoking cessation, and avoid fatty foods, chocolate, peppermint, colas, red wine, and acidic juices such as orange juice.  NO MINT OR MENTHOL PRODUCTS SO NO COUGH DROPS  USE SUGARLESS CANDY INSTEAD (Jolley ranchers or Stover's or Life Savers) or even ice chips will also do - the key is to swallow to prevent all throat clearing. NO OIL BASED VITAMINS - use powdered substitutes.  Return in 2 weeks if not better to your satisfaction > should be able to come off all the meds after birth>  if not,  return here to regroup

## 2014-04-18 NOTE — Progress Notes (Signed)
Subjective:    Patient ID: Krista Mcintosh, female    DOB: [birth year],   MRN: [id number]  HPI  [age] yowf quit smoking dx with pna sev times before age of 39 and allergy [facility] [place] dx as asthma on shots x 6 year but rarely needed saba including dancing/ swimming and started smoking about same time but did great until  2010 some asthma with 1st  pregnancy on prn saba and no problem after deliver or with second one in 2012-13 and no need for inhaleer then and resumed full activity now referred by [place] ParmaNancy Harman for dx asthma in 3rd IUP 04/18/2014 @34  weeks complicated by  chronic cough    04/18/2014 1st [facility] office visit/ Krista Mcintosh   Chief Complaint  Patient presents with  . Pulmonary Consult    Self referral. Pt c/o cough and SOB since October 2015-symptoms have improved alot with qvar over the past month.  Cough is prod occ with clear to yellow sputum. She c/o pain under rt breast- worse with inspiration since mid Jan 2016.   cough came on Oct 2015  As part of uri persisted through Dec 2015 > Ricard Pang > rec saba which helped breathingx 4-6 h and did help cough also and rx qvar 80 2bid Jan 16 and quite a bit better but is left with persistent day > noct cough and pain under r breast x one month worse with coughing and better since not coughing as much attributed to qvar.   No obvious other patterns in day to day or daytime variabilty or assoc cp or chest tightness, subjective wheeze overt sinus or hbsymptoms on pepcid qd . No unusual exp hx or h/o childhood pna/ asthma or knowledge of premature birth.  Sleeping ok without nocturnal  or early am exacerbation  of respiratory  c/o's or need for noct saba. Also denies any obvious fluctuation of symptoms with weather or environmental changes or other aggravating or alleviating factors except as outlined above   Current Medications, Allergies, Complete Past Medical History, Past Surgical History, Family History, and Social History were  reviewed in [facility] electronic medical record.              Review of Systems  Constitutional: Negative for fever, chills and unexpected weight change.  HENT: Positive for congestion, sore throat and voice change. Negative for dental problem, ear pain, nosebleeds, postnasal drip, rhinorrhea, sinus pressure, sneezing and trouble swallowing.   Eyes: Negative for visual disturbance.  Respiratory: Positive for cough and shortness of breath. Negative for choking.   Cardiovascular: Positive for chest pain. Negative for leg swelling.  Gastrointestinal: Negative for vomiting, abdominal pain and diarrhea.  Genitourinary: Negative for difficulty urinating.  Musculoskeletal: Negative for arthralgias.  Skin: Positive for rash.  Neurological: Negative for tremors, syncope and headaches.  Hematological: Does not bruise/bleed easily.       Objective:   Physical Exam  amb wf with mild voice fatigue  Wt Readings from Last 3 Encounters:  04/18/14 176 lb (79.833 kg)  03/26/14 173 lb (78.472 kg)  03/17/14 171 lb (77.565 kg)    Vital signs reviewed  HEENT: nl dentition, turbinates, and orophanx. Nl external ear canals without cough reflex   NECK :  without JVD/Nodes/TM/ nl carotid upstrokes bilaterally   LUNGS: no acc muscle use, clear to A and P bilaterally without cough on insp or exp maneuvers   CV:  RRR  no s3 or murmur or increase in P2, no  edema   ABD:   IUP c/w stated age gestation o/w  soft and nontender with nl excursion in the supine position. No bruits or organomegaly, bowel sounds nl  MS:  warm without deformities, calf tenderness, cyanosis or clubbing  SKIN: warm and dry without lesions    NEURO:  alert, approp, no deficits    No xrays on file       Assessment & Plan:

## 2014-04-18 NOTE — Assessment & Plan Note (Signed)
The most common causes of chronic cough in immunocompetent adults include the following: upper airway cough syndrome (UACS), previously referred to as postnasal drip syndrome (PNDS), which is caused by variety of rhinosinus conditions; (2) asthma; (3) GERD; (4) chronic bronchitis from cigarette smoking or other inhaled environmental irritants; (5) nonasthmatic eosinophilic bronchitis; and (6) bronchiectasis.   These conditions, singly or in combination, have accounted for up to 94% of the causes of chronic cough in prospective studies.   Other conditions have constituted no >6% of the causes in prospective studies These have included bronchogenic carcinoma, chronic interstitial pneumonia, sarcoidosis, left ventricular failure, ACEI-induced cough, and aspiration from a condition associated with pharyngeal dysfunction.    Chronic cough is often simultaneously caused by more than one condition. A single cause has been found from 38 to 82% of the time, multiple causes from 18 to 62%. Multiply caused cough has been the result of three diseases up to 42% of the time.       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   See instructions for specific recommendations which were reviewed directly with the patient who was given a copy with highlighter outlining the key components.

## 2015-01-20 ENCOUNTER — Encounter (HOSPITAL_COMMUNITY): Payer: Self-pay | Admitting: *Deleted

## 2015-09-23 ENCOUNTER — Other Ambulatory Visit: Payer: Self-pay | Admitting: Physician Assistant

## 2015-09-23 DIAGNOSIS — H918X9 Other specified hearing loss, unspecified ear: Secondary | ICD-10-CM

## 2015-10-16 ENCOUNTER — Ambulatory Visit
Admission: RE | Admit: 2015-10-16 | Discharge: 2015-10-16 | Disposition: A | Discharge status: Home / Self Care | Payer: BC Managed Care – PPO | Source: Ambulatory Visit | Attending: Physician Assistant | Admitting: Physician Assistant

## 2015-10-16 DIAGNOSIS — H918X9 Other specified hearing loss, unspecified ear: Secondary | ICD-10-CM

## 2015-10-16 MED ORDER — GADOBENATE DIMEGLUMINE 529 MG/ML IV SOLN
14.0000 mL | Freq: Once | INTRAVENOUS | Status: AC | PRN
  Administered 2015-10-16: 14 mL via INTRAVENOUS

## 2016-04-15 ENCOUNTER — Ambulatory Visit (INDEPENDENT_AMBULATORY_CARE_PROVIDER_SITE_OTHER): Payer: BC Managed Care – PPO | Admitting: Family Medicine

## 2016-04-15 VITALS — BP 112/56 | HR 96 | Temp 99.4°F | Resp 20 | Ht 66.0 in | Wt 165.0 lb

## 2016-04-15 DIAGNOSIS — R6889 Other general symptoms and signs: Secondary | ICD-10-CM

## 2016-04-15 MED ORDER — HYDROCOD POLST-CPM POLST ER 10-8 MG/5ML PO SUER: 5.0000 mL | Freq: Two times a day (BID) | ORAL | 0 refills | Status: DC | PRN

## 2016-04-15 MED ORDER — OSELTAMIVIR PHOSPHATE 75 MG PO CAPS: 75.0000 mg | ORAL_CAPSULE | Freq: Two times a day (BID) | ORAL | 0 refills | Status: DC

## 2016-04-15 NOTE — Patient Instructions (Addendum)
Start Tamiflu 75 mg twice daily x 5 days.  For cough, chlorpheniramine-hydrocodone (Tussionex) 5 ml every 12 hours. Medication causes drowsiness and or sedation.    IF you received an x-ray today, you will receive an invoice from [facility]. Please contact [facility] at [phone] with questions or concerns regarding your invoice.   IF you received labwork today, you will receive an invoice from [facility]. Please contact [facility] at [phone] with questions or concerns regarding your invoice.   Our billing staff will not be able to assist you with questions regarding bills from these companies.  You will be contacted with the lab results as soon as they are available. The fastest way to get your results is to activate your My Chart account. Instructions are located on the last page of this paperwork. If you have not heard from us regarding the results in 2 weeks, please contact this office.     Influenza, Adult Influenza ("the flu") is an infection in the lungs, nose, and throat (respiratory tract). It is caused by a virus. The flu causes many common cold symptoms, as well as a high fever and body aches. It can make you feel very sick. The flu spreads easily from person to person (is contagious). Getting a flu shot (influenza vaccination) every year is the best way to prevent the flu. Follow these instructions at home:  Take over-the-counter and prescription medicines only as told by your doctor.  Use a cool mist humidifier to add moisture (humidity) to the air in your home. This can make it easier to breathe.  Rest as needed.  Drink enough fluid to keep your pee (urine) clear or pale yellow.  Cover your mouth and nose when you cough or sneeze.  Wash your hands with soap and water often, especially after you cough or sneeze. If you cannot use soap and water, use hand sanitizer.  Stay home from work or school as told by your doctor. Unless you are visiting your  doctor, try to avoid leaving home until your fever has been gone for 24 hours without the use of medicine.  Keep all follow-up visits as told by your doctor. This is important. How is this prevented?  Getting a yearly (annual) flu shot is the best way to avoid getting the flu. You may get the flu shot in late summer, fall, or winter. Ask your doctor when you should get your flu shot.  Wash your hands often or use hand sanitizer often.  Avoid contact with people who are sick during cold and flu season.  Eat healthy foods.  Drink plenty of fluids.  Get enough sleep.  Exercise regularly. Contact a doctor if:  You get new symptoms.  You have:  Chest pain.  Watery poop (diarrhea).  A fever.  Your cough gets worse.  You start to have more mucus.  You feel sick to your stomach (nauseous).  You throw up (vomit). Get help right away if:  You start to be short of breath or have trouble breathing.  Your skin or nails turn a bluish color.  You have very bad pain or stiffness in your neck.  You get a sudden headache.  You get sudden pain in your face or ear.  You cannot stop throwing up. This information is not intended to replace advice given to you by your health care provider. Make sure you discuss any questions you have with your health care provider. Document Released: 12/05/2007 Document Revised: 08/03/2015 Document Reviewed: 12/20/2014  Chartered certified accountant Patient Education  [employer].

## 2016-04-15 NOTE — Progress Notes (Signed)
Patient ID: Krista Mcintosh, female    DOB: [birth year], 41 y.o.   MRN: [id number]  PCP: Juline PatchPANG,RICHARD, MD  Chief Complaint  Patient presents with  . Cough    GREENISH YELLOW SPUTUM  . Fever  . Headache  . Chest Pain    CONGESTION   . Nasal Congestion    Subjective:  HPI 41 year old female presents for evaluation of cough, congestion, headache,  Hx of asthma which is well controlled. Fever started yesterday highest 102.5 , alternating tylenol and ibuprofen. Last week had some hoarseness. Other son is ill x yesterday, no cough or runny nose and he tested negative for influenza. Reports a prior hx of bronchospasm. She is experiencing chest tenderness, cough, yesterday cough became productive.  Reports a "terrible" headache non relieved with ibuprofen or naproxen.   Social History   Social History  . Marital status: Married    Spouse name: N/A  . Number of children: N/A  . Years of education: N/A   Occupational History  . health educator    Social History Main Topics  . Smoking status: Former Smoker    Packs/day: 1.00    Years: 10.00    Types: Cigarettes    Quit date: 08/05/2003  . Smokeless tobacco: Never Used  . Alcohol use No     Comment: none while pregnant  . Drug use: No  . Sexual activity: Yes    Birth control/ protection: None   Other Topics Concern  . Not on file   Social History Narrative  . No narrative on file    Family History  Problem Relation Age of Onset  . Asthma Father   . Allergies Father   . Diabetes Brother   . Hyperlipidemia Brother   . Heart disease Maternal Grandmother   . Stroke Maternal Grandmother   . Hypertension Maternal Grandmother   . Diabetes Maternal Grandmother   . Heart disease Paternal Grandmother    Review of Systems See HPI Patient Active Problem List   Diagnosis Date Noted  . Persistent cough for 3 weeks or longer 03/26/2014  . [redacted] weeks gestation of pregnancy 03/26/2014    No Known Allergies  Prior to  Admission medications   Medication Sig Start Date End Date Taking? Authorizing Provider  acetaminophen (TYLENOL) 500 MG tablet Take 1,000 mg by mouth every 6 (six) hours as needed for mild pain.   Yes Historical Provider, MD  cetirizine (ZYRTEC) 10 MG tablet Take 10 mg by mouth daily.   Yes Historical Provider, MD  cholecalciferol (VITAMIN D) 1000 units tablet Take 1,000 Units by mouth daily.   Yes Historical Provider, MD  fluticasone (FLONASE) 50 MCG/ACT nasal spray Place 2 sprays into the nose daily.   Yes Historical Provider, MD  omega-3 acid ethyl esters (LOVAZA) 1 g capsule Take by mouth 2 (two) times daily.   Yes Historical Provider, MD  Prenatal Vit-Fe Fumarate-FA (PRENATAL MULTIVITAMIN) TABS Take 1 tablet by mouth daily.   Yes Historical Provider, MD  Probiotic Product (PROBIOTIC PO) Take 1 tablet by mouth daily.   Yes Historical Provider, MD  albuterol (PROVENTIL HFA;VENTOLIN HFA) 108 (90 BASE) MCG/ACT inhaler Inhale 1-2 puffs into the lungs every 6 (six) hours as needed for wheezing or shortness of breath.    Historical Provider, MD  beclomethasone (QVAR) 40 MCG/ACT inhaler Take 2 puffs first thing in am and then another 2 puffs about 12 hours later. Patient not taking: Reported on 04/15/2016 04/18/14   Nyoka CowdenMichael B Wert,  MD  Cholecalciferol (VITAMIN D PO) Take 1 tablet by mouth daily.    Historical Provider, MD  famotidine (PEPCID) 20 MG tablet Take 1 tablet (20 mg total) by mouth daily. Patient not taking: Reported on 04/15/2016 03/26/14   Rodell Perna, NP  IRON PO Take 1 tablet by mouth daily.    Historical Provider, MD    Past Medical, Surgical Family and Social History reviewed and updated.    Objective:   Today's Vitals   04/15/16 1733  BP: (!) 112/56  Pulse: 96  Resp: 20  Temp: 99.4 F (37.4 C)  TempSrc: Oral  SpO2: 99%  Weight: 165 lb (74.8 kg)  Height: 5\' 6"  (1.676 m)    Wt Readings from Last 3 Encounters:  04/15/16 165 lb (74.8 kg)  04/18/14 176 lb (79.8 kg)    03/26/14 173 lb (78.5 kg)   Physical Exam  Constitutional: She is oriented to person, place, and time. She appears well-developed and well-nourished. She has a sickly appearance. She appears ill.  HENT:  Head: Normocephalic.  Eyes:  Watery eyes  Cardiovascular: Normal rate, regular rhythm, normal heart sounds and intact distal pulses.   Pulmonary/Chest: Effort normal. She has wheezes in the right middle field and the left middle field.  Expiratory wheeze.  Musculoskeletal: Normal range of motion.  Neurological: She is alert and oriented to person, place, and time.  Skin: Skin is warm and dry.  Psychiatric: She has a normal mood and affect. Her behavior is normal. Judgment and thought content normal.    Assessment & Plan:  1. Flu-like symptoms -Start Tamiflu 75 mg twice daily x 5 days. -For cough, chlorpheniramine-hydrocodone (Tussionex) 5 ml every 12 hours. Medication causes drowsiness and or sedation.  -Continue to alternate tylenol and Ibuprofen for fever management. -Do not return to work until you are fever free for 24 hours without Tylenol or Ibuprofen.  -Continue albuterol inhaler 2 puff every 4-6 hours as needed for shortness of breath.  Return for care if symptom worsens or do not improve.  Godfrey Pick. Tiburcio Pea, MSN, FNP-C Primary Care at [facility] [phone]

## 2016-12-10 ENCOUNTER — Ambulatory Visit (INDEPENDENT_AMBULATORY_CARE_PROVIDER_SITE_OTHER): Payer: BC Managed Care – PPO | Admitting: Urgent Care

## 2016-12-10 ENCOUNTER — Ambulatory Visit: Payer: BC Managed Care – PPO | Admitting: Urgent Care

## 2016-12-10 ENCOUNTER — Ambulatory Visit (INDEPENDENT_AMBULATORY_CARE_PROVIDER_SITE_OTHER): Payer: BC Managed Care – PPO

## 2016-12-10 ENCOUNTER — Encounter: Payer: Self-pay | Admitting: Urgent Care

## 2016-12-10 VITALS — BP 92/60 | HR 54 | Temp 98.2°F | Resp 16 | Ht 66.0 in | Wt 159.0 lb

## 2016-12-10 DIAGNOSIS — M533 Sacrococcygeal disorders, not elsewhere classified: Secondary | ICD-10-CM

## 2016-12-10 DIAGNOSIS — M25551 Pain in right hip: Secondary | ICD-10-CM

## 2016-12-10 DIAGNOSIS — Z23 Encounter for immunization: Secondary | ICD-10-CM

## 2016-12-10 DIAGNOSIS — W19XXXD Unspecified fall, subsequent encounter: Secondary | ICD-10-CM | POA: Diagnosis not present

## 2016-12-10 MED ORDER — CELECOXIB 100 MG PO CAPS: 100.0000 mg | ORAL_CAPSULE | Freq: Two times a day (BID) | ORAL | 1 refills | Status: AC

## 2016-12-10 MED ORDER — CYCLOBENZAPRINE HCL 5 MG PO TABS: 5.0000 mg | ORAL_TABLET | Freq: Three times a day (TID) | ORAL | 1 refills | Status: AC | PRN

## 2016-12-10 NOTE — Progress Notes (Signed)
  MRN: [id number] DOB: 1976/02/04  Subjective:   Krista Mcintosh is a 41 y.o. female presenting for chief complaint of Tailbone Pain (fell 1.5 week ago) and Flu Vaccine  Reports 1.5 week history of buttock pain from falling onto hard carpet floor. She has since had persistent achy sacral pain. Pain is worsening, constantly achy and intermittently sharp with leaning back onto her upper buttock. Patient is very active, has been running. Has been taking CBD oil, ibuprofen ( ) without any relief. She also admits ~2 year history of intermittent right hip and buttock achy pain that can radiate into right posterior thigh. Denies fever, dysuria, hematuria, numbness or tingling.  Luisa has a current medication list which includes the following prescription(s): cetirizine, fluticasone, iron, omega-3 acid ethyl esters, prenatal multivitamin, and probiotic product. Also has No Known Allergies.  Evaleen  has a past medical history of Asthma. Also  has a past surgical history that includes Tonsillectomy.  Objective:   Vitals: BP 92/60   Pulse (!) 54   Temp 98.2 F (36.8 C) (Oral)   Resp 16   Ht  (1.676 m)   Wt 159 lb (72.1 kg)   SpO2 98%   BMI 25.66 kg/m   Physical Exam  Constitutional: She is oriented to person, place, and time. She appears well-developed and well-nourished.  Cardiovascular: Normal rate.   Pulmonary/Chest: Effort normal.  Musculoskeletal:       Right hip: She exhibits tenderness (over area depicted). She exhibits normal range of motion, normal strength, no bony tenderness, no swelling, no crepitus, no deformity and no laceration.       Lumbar back: She exhibits tenderness (over areas depicted and with ROM testing). She exhibits normal range of motion, no swelling, no edema, no deformity and no spasm.       Back:       Legs: Neurological: She is alert and oriented to person, place, and time.   Dg Sacrum/coccyx  Result Date: 12/10/2016 CLINICAL DATA:  Tail bony  injury with persistent pain, initial encounter EXAM: SACRUM AND COCCYX - 2+ VIEW COMPARISON:  None FINDINGS: Pelvic ring is intact. The sacral ala are unremarkable. No fracture is seen. Some anterior orientation of the coccyx is noted likely developmental in nature. No other focal abnormality is seen. IMPRESSION: No acute abnormality noted. Electronically Signed   By: Alcide Clever M.D.   On: 12/10/2016 12:22   Dg Hip Unilat W Or W/o Pelvis 2-3 Views Right  Result Date: 12/10/2016 CLINICAL DATA:  Right hip pain, no known injury, initial encounter EXAM: DG HIP (WITH OR WITHOUT PELVIS) 2-3V RIGHT COMPARISON:  None. FINDINGS: Pelvic ring is intact. No acute fracture or dislocation is seen. No soft tissue abnormality is noted. IMPRESSION: No acute abnormality noted. Electronically Signed   By: Alcide Clever M.D.   On: 12/10/2016 12:23    Assessment and Plan :   1. Sacral back pain 2. Right hip pain 3. Fall, subsequent encounter - Will start conservative management, NSAID, modification of activities, physical therapy. Return-to-clinic precautions discussed, patient verbalized understanding.   4. Need for influenza vaccination - Flu Vaccine QUAD 6+ mos PF IM (Fluarix Quad PF)   Wallis Bamberg, PA-C Primary Care at [facility] [phone] 12/10/2016  11:59 AM

## 2016-12-10 NOTE — Patient Instructions (Addendum)
Please try to get a consult with [facility] for your right hip pain. [phone] Please call Julieanne Cotton, Office Manager to set up an appointment     Tailbone Injury The tailbone is the small bone at the lower end of the backbone (spine). You may have stretched tissues, bruises, or a broken bone (fracture). These injuries can be painful. Most tailbone injuries get better on their own in 4-6 weeks. Follow these instructions at home:  Take medicines only as told by your doctor.  If told, apply ice to the injured area. ? Put ice in a plastic bag. ? Place a towel between your skin and the bag. ? Leave the ice on for 20 minutes, 2-3 times per day. Do this for the first 1-2 days.  Sit on a large, rubber or inflated ring or cushion to lessen pain. Lean forward when you sit to help lessen pain.  Avoid sitting in one place for a long time.  Increase your activity as the pain allows.  Do exercises as told by your doctor or physical therapist.  If it is painful to poop, take medicine to help you poop (stool softeners) as told by your doctor.  Eat foods that have plenty of fiber.  Keep all follow-up visits as told by your doctor. This is important. Contact a doctor if:  Your pain gets worse.  Pooping causes you pain.  You cannot poop (constipation).  You are leaking pee (urinary incontinence).  You have a fever. This information is not intended to replace advice given to you by your health care provider. Make sure you discuss any questions you have with your health care provider. Document Released: 03/30/2010 Document Revised: 10/26/2015 Document Reviewed: 02/21/2014 Elsevier Interactive Patient Education  2018 [employer].     IF you received an x-ray today, you will receive an invoice from [facility]. Please contact [facility] at [phone] with questions or concerns regarding your invoice.   IF you received labwork today, you will  receive an invoice from [facility]. Please contact [facility] at [phone] with questions or concerns regarding your invoice.   Our billing staff will not be able to assist you with questions regarding bills from these companies.  You will be contacted with the lab results as soon as they are available. The fastest way to get your results is to activate your My Chart account. Instructions are located on the last page of this paperwork. If you have not heard from [place] regarding the results in 2 weeks, please contact this office.

## 2018-03-18 ENCOUNTER — Encounter: Payer: Self-pay | Admitting: Physical Therapy

## 2018-03-18 ENCOUNTER — Ambulatory Visit: Payer: Medicaid Other | Attending: Physician Assistant | Admitting: Physical Therapy

## 2018-03-18 ENCOUNTER — Other Ambulatory Visit: Payer: Self-pay

## 2018-03-18 DIAGNOSIS — M6281 Muscle weakness (generalized): Secondary | ICD-10-CM | POA: Insufficient documentation

## 2018-03-18 DIAGNOSIS — M62838 Other muscle spasm: Secondary | ICD-10-CM | POA: Insufficient documentation

## 2018-03-18 DIAGNOSIS — G8929 Other chronic pain: Secondary | ICD-10-CM | POA: Insufficient documentation

## 2018-03-18 DIAGNOSIS — M5441 Lumbago with sciatica, right side: Secondary | ICD-10-CM | POA: Diagnosis present

## 2018-03-18 NOTE — Therapy (Signed)
[facility] [address] [place], [place], [address] Phone: [phone]   Fax:  [phone]  Physical Therapy Evaluation  Patient Details  Name: Krista Mcintosh D Struthers MRN: [id number] Date of Birth: February 12, 1976 Referring Provider (PT): Maud Deedesiree Howley PA-C   Encounter Date: 03/18/2018  PT End of Session - 03/18/18 1313    Visit Number  1    Number of Visits  4   eval plus 3 treats   Date for PT Re-Evaluation  04/08/18    Authorization Type  MCD  - requested 3 treatments.     PT Start Time  1317    PT Stop Time  1411    PT Time Calculation (min)  54 min    Activity Tolerance  Patient tolerated treatment well       Past Medical History:  Diagnosis Date  . Asthma     Past Surgical History:  Procedure Laterality Date  . TONSILLECTOMY      There were no vitals filed for this visit.   Subjective Assessment - 03/18/18 1317    Subjective  Pt reports she has had back and Rt hip pain for over three years of intermittent pain, ever since her pregnacy.  Over the last 6 months the pain has gotten worse.  She has had to cut back on running due to the pain.  There is always some type of a throbbing pain in the back and hip.  Mobic is very helpful to relieve her symptoms. She has never had PT for this.     Pertinent History  has had three children, currently swims laps when able, reduced her running.      Diagnostic tests  xrays in 2018 were (-)     Patient Stated Goals  not be in pain and get back to runningand get off medication     Currently in Pain?  Yes    Pain Score  5     Pain Location  Back    Pain Orientation  Right    Pain Type  Chronic pain    Pain Radiating Towards  into Rt mid thigh, some times into her foot.     Pain Onset  More than a month ago    Pain Frequency  Constant    Aggravating Factors   not sure, was after running, some times with stretching.     Pain Relieving Factors  medication. heat         [facility] PT  Assessment - 03/18/18 0001      Assessment   Medical Diagnosis  Rt sciatica and low back pain    Referring Provider (PT)  Maud Deedesiree Howley PA-C    Onset Date/Surgical Date  11/16/17    Hand Dominance  Right    Next MD Visit  PRN - will schedule    Prior Therapy  none      Precautions   Precautions  None      Balance Screen   Has the patient fallen in the past 6 months  Yes    How many times?  2   yesterday slipped on her heels, fell forward. and 10/19   Has the patient had a decrease in activity level because of a fear of falling?   No    Is the patient reluctant to leave their home because of a fear of falling?   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    Leisure  play with 3 kids      Observation/Other Assessments   Other Surveys   Other Surveys    Oswestry Disability Index   15/50, 30%       Functional Tests   Functional tests  Single leg stance      Single Leg Stance   Comments  bilat trendelenburg      Posture/Postural Control   Posture/Postural Control  Postural limitations    Postural Limitations  Rounded Shoulders;Increased lumbar lordosis      ROM / Strength   AROM / PROM / Strength  AROM;Strength      AROM   AROM Assessment Site  Lumbar;Hip    Lumbar Flexion  to floor    Lumbar Extension  50% present with pain/compression Rt low back    Lumbar - Left Side Bend  --   with rotation severe Rt sided low back and hip pain.   Lumbar - Right Rotation  WNl    Lumbar - Left Rotation  WNL      Strength   Strength Assessment Site  Hip;Knee;Lumbar    Right/Left Hip  Right;Left    Right Hip Flexion  5/5    Right Hip ABduction  4-/5    Left  Hip Flexion  5/5    Left Hip ABduction  4/5    Right/Left Knee  --   Rt WNL, Lt grossly 4/5   Lumbar Flexion  --   TA fair   Lumbar Extension  --   multifidi fair      Flexibility   Soft Tissue Assessment /Muscle Length  yes    Hamstrings  WNL    Quadriceps  WNL    ITB  WNL    Piriformis  WNL      Palpation   Spinal mobility  pain and hypomobile with CPA L4-2, Lt UPA L3-4, Rt L3                 Objective measurements completed on examination: See above findings.      [facility] Adult PT Treatment/Exercise - 03/18/18 0001      Exercises   Exercises  Lumbar      Lumbar Exercises: Stretches   Other Lumbar Stretch Exercise  childs pose       Lumbar Exercises: Standing   Other Standing Lumbar Exercises  pelvic press, then with hip extension and bent knee hip extension, hip hiking             PT Education - 03/18/18 1405    Education Details  HEP and effects of hypermobility on joints with running    Person(s) Educated  Patient    Methods  Explanation;Demonstration;Handout    Comprehension  Returned demonstration;Verbalized understanding;Verbal cues required       PT Short Term Goals - 03/18/18 1433      PT SHORT TERM GOAL #1   Title  I with initial HEP for hips and core9 04/08/2018)     Baseline  not performing any strengthening - only cardio    Time  3    Period  Weeks    Status  New    Target Date  04/08/18      PT SHORT TERM GOAL #2   Title  perform single leg stance bilat without trendelenberg ( 04/08/2018)     Baseline  has hip drop bilat sides in single leg stance     Time  3    Period  Weeks    Status  New    Target Date  04/08/18      PT SHORT TERM GOAL #3   Title  increase bilat hip abduction =/> 5-/5 without pain ( 04/08/2018)     Baseline  4 to 4-/5 strength     Time  3    Period  Weeks    Status  New    Target Date  04/08/18      PT SHORT TERM GOAL #4   Title  demo strong contraction of multifidi ( 04/08/2018)     Baseline  fair  contraction    Time  3    Period  Weeks    Status  New    Target Date  04/08/18      PT SHORT TERM GOAL #5   Title  report overall decrease of pain =/> 50% ( 04/08/2018)     Baseline  constant pain varies from 2-8/10    Time  3    Period  Weeks    Status  New    Target Date  04/08/18        PT Long Term Goals - 03/18/18 1456      PT LONG TERM GOAL #1   Title  to be established at renewal to include a running goal     Baseline  has stopped running    Time  3    Period  Weeks    Status  New    Target Date  04/08/18             Plan - 03/18/18 1419    Clinical Impression Statement  43 yo female with progressive Rt low back, hip and leg pain starting after her third child about 4 years ago.  She used to be able to run about 5 miles a day and can no longer do this.  She fell end of last year and bruised her sacrum, sprined her ankle and this has not helped.  She is weak in bilat hips and core, has some trigger points in the Rt gluts and piriformis, may benefit from DN.  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 This improved after spinal mobs.  She would benefit from PT to increase her strength and stability to decrease force up through her back with running.     History and Personal Factors relevant to plan of care:  h/o bilat ankle sprains within last 6 months, sacral bruise in October    Clinical Presentation  Stable    Clinical Decision Making  Low    Rehab Potential  Excellent    PT Frequency  1x / week    PT Duration  4 weeks    PT Treatment/Interventions  Iontophoresis 4mg /ml Dexamethasone;Neuromuscular re-education;Moist Heat;Dry needling;Manual techniques;Spinal Manipulations;Patient/family education;Cryotherapy;Electrical Stimulation;Therapeutic exercise;Taping    PT Next Visit Plan  hip and core strengthening - work in single leg stance in preparation for return to running.  spinal mobs PRN    Consulted and Agree with Plan of Care  Patient  Patient will benefit from skilled therapeutic intervention in order to improve the following deficits and impairments:  Pain, Improper body mechanics, Increased muscle spasms, Decreased strength, Hypomobility  Visit Diagnosis: Chronic right-sided low back pain with right-sided sciatica - Plan: PT plan of care cert/re-cert  Muscle weakness (generalized) - Plan: PT plan of care cert/re-cert  Other muscle spasm - Plan: PT plan of care cert/re-cert     Problem List Patient Active Problem List   Diagnosis Date Noted  . Persistent cough for 3 weeks or longer 03/26/2014  . [redacted] weeks gestation of pregnancy 03/26/2014    Roderic ScarceSusan Sharlynn Seckinger PT  03/18/2018, 3:00 PM  [facility] [address] [place], [place], [address] Phone: [phone]   Fax:  [phone]  Name: Krista Mcintosh D Routzahn MRN: [id number] Date of Birth: [birth year]

## 2018-03-25 ENCOUNTER — Ambulatory Visit: Payer: Medicaid Other | Admitting: Physical Therapy

## 2018-03-25 ENCOUNTER — Encounter: Payer: Self-pay | Admitting: Physical Therapy

## 2018-03-25 DIAGNOSIS — M6281 Muscle weakness (generalized): Secondary | ICD-10-CM

## 2018-03-25 DIAGNOSIS — G8929 Other chronic pain: Secondary | ICD-10-CM

## 2018-03-25 DIAGNOSIS — M5441 Lumbago with sciatica, right side: Secondary | ICD-10-CM | POA: Diagnosis not present

## 2018-03-25 DIAGNOSIS — M62838 Other muscle spasm: Secondary | ICD-10-CM

## 2018-03-25 NOTE — Therapy (Signed)
[place], [place], [address] Phone: [phone]   Fax:  [phone]  Physical Therapy Treatment  Patient Details  Name: Krista Mcintosh MRN: [id number] Date of Birth: 02-06-1976 Referring Provider (PT): Willeen Niece PA-C   Encounter Date: 03/25/2018  PT End of Session - 03/25/18 1112    Visit Number  2    Number of Visits  4    Date for PT Re-Evaluation  04/08/18    Authorization Type  MCD approved 3 tx ( 4 visits total with eval) through 04/12/2018    PT Start Time  1108   in late   PT Stop Time  1146    PT Time Calculation (min)  38 min    Activity Tolerance  Patient tolerated treatment well       Past Medical History:  Diagnosis Date  . Asthma     Past Surgical History:  Procedure Laterality Date  . TONSILLECTOMY      There were no vitals filed for this visit.  Subjective Assessment - 03/25/18 1110    Subjective  Pt reports she has been pretty sore since her last visit, sleeping on the Rt side makes it worse.  Doing the HEP    Patient Stated Goals  not be in pain and get back to runningand get off medication     Currently in Pain?  Yes    Pain Score  7     Pain Location  Back    Pain Orientation  Right    Pain Descriptors / Indicators  Aching;Throbbing    Pain Type  Chronic pain    Pain Radiating Towards  to back of knee    Pain Onset  More than a month ago    Pain Frequency  Constant    Aggravating Factors   not sure    Pain Relieving Factors  medication                       [facility] Adult PT Treatment/Exercise - 03/25/18 0001      Exercises   Exercises  Lumbar      Lumbar Exercises: Stretches   Hip Flexor Stretch  Left;Right;30 seconds;2 reps   30 sec in low kneel, VC to keep core tight   ITB Stretch  Right;Left   45 sec cross body with stretch   Piriformis Stretch  Left;Right;30 seconds      Lumbar Exercises: Aerobic   Stationary Bike  L2x5'      Lumbar Exercises: Standing   Forward Lunge Limitations  10 reps courtsey lunges each side.     Other Standing Lumbar Exercises  10 reps single leg dead lifts no wt.  SLS with lifting leg into flex/abduct/ER  per handout      Lumbar Exercises: Supine   Other Supine Lumbar Exercises  table top 90/90, TA contaction with knee extensions , VC for form      Lumbar Exercises: Sidelying   Other Sidelying Lumbar Exercises  10 reps each both sides. pilates FWD/BWD kicks, CW/CCW, FWD /BWD taps               PT Short Term Goals - 03/18/18 1433      PT SHORT TERM GOAL #1   Title  I with initial HEP for hips and core9 04/08/2018)     Baseline  not performing any strengthening - only cardio    Time  3    Period  Weeks    Status  New    Target Date  04/08/18      PT SHORT TERM GOAL #2   Title  perform single leg stance bilat without trendelenberg ( 04/08/2018)     Baseline  has hip drop bilat sides in single leg stance     Time  3    Period  Weeks    Status  New    Target Date  04/08/18      PT SHORT TERM GOAL #3   Title  increase bilat hip abduction =/> 5-/5 without pain ( 04/08/2018)     Baseline  4 to 4-/5 strength     Time  3    Period  Weeks    Status  New    Target Date  04/08/18      PT SHORT TERM GOAL #4   Title  demo strong contraction of multifidi ( 04/08/2018)     Baseline  fair contraction    Time  3    Period  Weeks    Status  New    Target Date  04/08/18      PT SHORT TERM GOAL #5   Title  report overall decrease of pain =/> 50% ( 04/08/2018)     Baseline  constant pain varies from 2-8/10    Time  3    Period  Weeks    Status  New    Target Date  04/08/18        PT Long Term Goals - 03/18/18 1456      PT LONG TERM GOAL #1   Title  to be established at renewal to include a running goal     Baseline  has stopped running    Time  3    Period  Weeks    Status  New    Target Date  04/08/18            Plan - 03/25/18 1142    Clinical Impression  Statement  This is pts second visit.  She is doing ok with initial HEP. Has not had any change in her pain levels.  She fatigued with todays exercises and verbalized she didn't realize how weak she was. No goals met at this time.  HEP was modified to continue work on core and hips. She does have tightness through the Rt hip and may benefit from some dry needling if this continues.      Rehab Potential  Excellent    PT Frequency  1x / week    PT Duration  4 weeks    PT Treatment/Interventions  Iontophoresis 42m/ml Dexamethasone;Neuromuscular re-education;Moist Heat;Dry needling;Manual techniques;Spinal Manipulations;Patient/family education;Cryotherapy;Electrical Stimulation;Therapeutic exercise;Taping    PT Next Visit Plan  DN to Rt hip if still tight    Consulted and Agree with Plan of Care  Patient       Patient will benefit from skilled therapeutic intervention in order to improve the following deficits and impairments:  Pain, Improper body mechanics, Increased muscle spasms, Decreased strength, Hypomobility  Visit Diagnosis: Chronic right-sided low back pain with right-sided sciatica  Muscle weakness (generalized)  Other muscle spasm     Problem List Patient Active Problem List   Diagnosis Date Noted  . Persistent cough for 3 weeks or longer 03/26/2014  . [redacted] weeks gestation of pregnancy 03/26/2014    SBoneta LucksrPT  03/25/2018, 12:42 PM  [facility] [address]Phone: [phone]  Fax:  [phone]  Name: IVER FEHRENBACH MRN: [id number] Date of Birth: 25-Jul-1975

## 2018-04-01 ENCOUNTER — Ambulatory Visit: Payer: Medicaid Other | Admitting: Physical Therapy

## 2018-04-01 ENCOUNTER — Encounter: Payer: Self-pay | Admitting: Physical Therapy

## 2018-04-01 DIAGNOSIS — M5441 Lumbago with sciatica, right side: Secondary | ICD-10-CM | POA: Diagnosis not present

## 2018-04-01 DIAGNOSIS — M62838 Other muscle spasm: Secondary | ICD-10-CM

## 2018-04-01 DIAGNOSIS — G8929 Other chronic pain: Secondary | ICD-10-CM

## 2018-04-01 DIAGNOSIS — M6281 Muscle weakness (generalized): Secondary | ICD-10-CM

## 2018-04-01 NOTE — Therapy (Signed)
[facility] [address] [place], [place], [address] Phone: [phone]   Fax:  [phone]  Physical Therapy Treatment  Patient Details  Name: Krista Mcintosh MRN: [id number] Date of Birth: 1975-04-24 Referring Provider (PT): Maud Deedesiree Howley PA-C   Encounter Date: 04/01/2018  PT End of Session - 04/01/18 1335    Visit Number  3    Number of Visits  4    Date for PT Re-Evaluation  04/08/18    Authorization Type  MCD approved 3 tx ( 4 visits total with eval) through 04/12/2018    PT Start Time  1335    PT Stop Time  1421    PT Time Calculation (min)  46 min    Activity Tolerance  Patient tolerated treatment well       Past Medical History:  Diagnosis Date  . Asthma     Past Surgical History:  Procedure Laterality Date  . TONSILLECTOMY      There were no vitals filed for this visit.  Subjective Assessment - 04/01/18 1337    Subjective  Pt reports she has had a couple days of bad Rt hip throbbing, not as bad today    Patient Stated Goals  not be in pain and get back to runningand get off medication     Currently in Pain?  Yes    Pain Score  3    up to 7-8/10 last couple of days.    Pain Location  Hip    Pain Orientation  Right    Pain Descriptors / Indicators  Sharp    Pain Type  Chronic pain    Pain Onset  More than a month ago    Pain Frequency  Constant    Aggravating Factors   still not sure - wondering if sitting contributions    Pain Relieving Factors  medication                       [facility] Adult PT Treatment/Exercise - 04/01/18 0001      Exercises   Exercises  Lumbar      Lumbar Exercises: Stretches   Single Knee to Chest Stretch  Left;Right    Piriformis Stretch  Left;Right    Other Lumbar Stretch Exercise  cat /cow, childs pose    Other Lumbar Stretch Exercise  cross body rotations to the Lt      Lumbar Exercises: Aerobic   Stationary Bike  L3x5'      Manual Therapy   Manual  Therapy  Soft tissue mobilization    Manual therapy comments  skilled palpation during DN    Soft tissue mobilization  STM with trigger point release to Rt lumbar paraspinals, gluts and piriformis. Decreased palpable tightness after tx.        Trigger Point Dry Needling - 04/01/18 1410    Muscles Treated Upper Body  Longissimus    Muscles Treated Lower Body  Gluteus maximus;Gluteus minimus;Piriformis    Longissimus Response  Twitch response elicited   L4-2 with stim   Gluteus Maximus Response  Palpable increased muscle length;Twitch response elicited    Gluteus Minimus Response  Twitch response elicited;Palpable increased muscle length   Rt   Piriformis Response  Palpable increased muscle length;Twitch response elicited   Rt             PT Short Term Goals - 03/18/18 1433      PT SHORT TERM GOAL #1   Title  I with initial HEP for hips and core9 04/08/2018)     Baseline  not performing any strengthening - only cardio    Time  3    Period  Weeks    Status  New    Target Date  04/08/18      PT SHORT TERM GOAL #2   Title  perform single leg stance bilat without trendelenberg ( 04/08/2018)     Baseline  has hip drop bilat sides in single leg stance     Time  3    Period  Weeks    Status  New    Target Date  04/08/18      PT SHORT TERM GOAL #3   Title  increase bilat hip abduction =/> 5-/5 without pain ( 04/08/2018)     Baseline  4 to 4-/5 strength     Time  3    Period  Weeks    Status  New    Target Date  04/08/18      PT SHORT TERM GOAL #4   Title  demo strong contraction of multifidi ( 04/08/2018)     Baseline  fair contraction    Time  3    Period  Weeks    Status  New    Target Date  04/08/18      PT SHORT TERM GOAL #5   Title  report overall decrease of pain =/> 50% ( 04/08/2018)     Baseline  constant pain varies from 2-8/10    Time  3    Period  Weeks    Status  New    Target Date  04/08/18        PT Long Term Goals - 03/18/18 1456      PT LONG  TERM GOAL #1   Title  to be established at renewal to include a running goal     Baseline  has stopped running    Time  3    Period  Weeks    Status  New    Target Date  04/08/18            Plan - 04/01/18 1424    Clinical Impression Statement  Krista Mcintosh reports continued pain into the Rt hip, had a good response to manual work today with reports of increased mobility.  She should continue to loosen up over the next couple of days and hopefully be ready to begin higher level strengthening.     Rehab Potential  Excellent    PT Frequency  1x / week    PT Duration  4 weeks    PT Treatment/Interventions  Iontophoresis 4mg /ml Dexamethasone;Neuromuscular re-education;Moist Heat;Dry needling;Manual techniques;Spinal Manipulations;Patient/family education;Cryotherapy;Electrical Stimulation;Therapeutic exercise;Taping    PT Next Visit Plan  assess response to DN and write renewal/request for more visits.     Consulted and Agree with Plan of Care  Patient       Patient will benefit from skilled therapeutic intervention in order to improve the following deficits and impairments:  Pain, Improper body mechanics, Increased muscle spasms, Decreased strength, Hypomobility  Visit Diagnosis: Chronic right-sided low back pain with right-sided sciatica  Muscle weakness (generalized)  Other muscle spasm     Problem List Patient Active Problem List   Diagnosis Date Noted  . Persistent cough for 3 weeks or longer 03/26/2014  . [redacted] weeks gestation of pregnancy 03/26/2014    Roderic Scarce PT  04/01/2018, 2:27 PM  [facility] [address]  [address] [place], [place], [address] Phone: [phone]   Fax:  [phone]  Name: Krista Mcintosh MRN: [id number] Date of Birth: [birth year]

## 2018-04-08 ENCOUNTER — Encounter: Payer: Medicaid Other | Admitting: Physical Therapy

## 2018-04-09 ENCOUNTER — Encounter: Payer: Self-pay | Admitting: Physical Therapy

## 2018-04-09 ENCOUNTER — Ambulatory Visit: Payer: Medicaid Other | Admitting: Physical Therapy

## 2018-04-09 DIAGNOSIS — M62838 Other muscle spasm: Secondary | ICD-10-CM

## 2018-04-09 DIAGNOSIS — G8929 Other chronic pain: Secondary | ICD-10-CM

## 2018-04-09 DIAGNOSIS — M5441 Lumbago with sciatica, right side: Principal | ICD-10-CM

## 2018-04-09 DIAGNOSIS — M6281 Muscle weakness (generalized): Secondary | ICD-10-CM

## 2018-04-09 NOTE — Patient Instructions (Signed)
Issued from exercise drawer:  JOSPT hip strengthening All issued Daily   5 to 10 reps   Hold 1 to 5 seconds   Modified for hip extension exercises to be done prone

## 2018-04-09 NOTE — Therapy (Addendum)
[facility] [address] [place], [place], [address] Phone: [phone]   Fax:  [phone]  Physical Therapy Treatment  Patient Details  Name: Krista Mcintosh MRN: [id number] Date of Birth: 05/15/1975 Referring Provider (PT): Maud Deedesiree Howley PA-C   Encounter Date: 04/09/2018  PT End of Session - 04/09/18 1832    Visit Number  4    Number of Visits  4    Date for PT Re-Evaluation  04/08/18    Authorization Type  MCD approved 3 tx ( 4 visits total with eval) through 04/12/2018    PT Start Time  1336    PT Stop Time  1415    PT Time Calculation (min)  39 min    Activity Tolerance  Patient tolerated treatment well    Behavior During Therapy  [facility] tasks assessed/performed       Past Medical History:  Diagnosis Date  . Asthma     Past Surgical History:  Procedure Laterality Date  . TONSILLECTOMY      There were no vitals filed for this visit.  Subjective Assessment - 04/09/18 1338    Subjective  DN,  stretches help temporarily.   Able to walk for exercises.  able to  walk an hour.  Not yet running.      Currently in Pain?  Yes    Pain Score  3    varies   Pain Location  Hip    Pain Orientation  Right    Pain Radiating Towards  to hip    Pain Frequency  Constant    Aggravating Factors   tiredness,      Pain Relieving Factors  meds,   heating pad,  sometimes piullows,   sleeping on back                       [facility]/Exercise - 04/09/18 0001      Exercises   Exercises  Lumbar      Lumbar Exercises: Stretches   Passive Hamstring Stretch  3 reps;30 seconds    Lower Trunk Rotation  3 reps;20 seconds    Lower Trunk Rotation Limitations  both    Piriformis Stretch  Left;Right;30 seconds    Other Lumbar Stretch Exercise  moving hips left [place] lateral shift.  1 X 30 seconds      Lumbar Exercises: Standing   Other Standing Lumbar Exercises  function squat  side steps with and  without blue band.      Lumbar Exercises: Supine   Bridge  10 reps    Single Leg Bridge  5 reps      Lumbar Exercises: Sidelying   Clam  10 reps   cued for straight back      Lumbar Exercises: Prone   Straight Leg Raise  10 reps    Straight Leg Raises Limitations  Noted multifitus engaged / active    Other Prone Lumbar Exercises  bent knee raise      Manual Therapy   Manual Therapy  Soft tissue mobilization    Manual therapy comments  instrument assist  and for trigger point release  tissue softened.  prone gluteal  trigger point release with Hip IR/ER stretching.  Hip ROM improved,  pain reduced.             PT Education - 04/09/18 1832    Education Details  HEP    Person(s) Educated  Patient  Methods  Explanation;Demonstration;Tactile cues;Verbal cues;Handout    Comprehension  Verbalized understanding;Returned demonstration       PT Short Term Goals - 04/09/18 1839      PT SHORT TERM GOAL #1   Title  I with initial HEP for hips and core9 05/08/2018)     Baseline  independent with exercises so far    Time  4   Period  Weeks    Status  On-going      PT SHORT TERM GOAL #2   Title  perform single leg stance bilat without trendelenberg ( 05/08/2018)     Time  4   Period  Weeks    Status  Unable to assess      PT SHORT TERM GOAL #3   Title  increase bilat hip abduction =/> 5-/5 without pain ( 05/08/2018)     Baseline  weakness continues, addressing with exercises,  she has been able to advance  to more challanging ex today    Time  4   Period  Weeks    Status  On-going      PT SHORT TERM GOAL #4   Title  demo strong contraction of multifidi ( 05/08/2018)     Baseline  good contraction in prone, endurance limited    Time  4   Period  Weeks    Status  On-going      PT SHORT TERM GOAL #5   Title  report overall decrease of pain =/> 50% ( 05/08/2018)     Baseline    pain decreases   however it has not consistantly decreased.    Time 4   Period  Weeks     Status  On-going        PT Long Term Goals - 03/18/18 1456      PT LONG TERM GOAL #1   Title  Tolerate interval running program to ease into her PLOF with running     Baseline    Time  4   Period  Weeks    Status  New    Target Date  05/08/18            Plan - 04/09/18 1833    Clinical Impression Statement  Hip strength deficits addressed with evidence based exercise.  Pain reduced with manual. (DN helpful last visit) Patient able to do current HEP independently..  Progress toward STG# 4 with prone exercises. Added in LTG for running.  Pt would benefit from progression to functional hip and core strengthening to prevent reinjury with return to sport.    PT Next Visit Plan   write renewal/request for more visits.   Review hip exercise,  check for calf tightness.  needs ADL Education,  lifting when ready    PT Home Exercise Plan  JOSPT hip strengthening.,  LTR,  Basic back    Consulted and Agree with Plan of Care  Patient       Patient will benefit from skilled therapeutic intervention in order to improve the following deficits and impairments:     Visit Diagnosis: Chronic right-sided low back pain with right-sided sciatica  Muscle weakness (generalized)  Other muscle spasm     Problem List Patient Active Problem List   Diagnosis Date Noted  . Persistent cough for 3 weeks or longer 03/26/2014  . [redacted] weeks gestation of pregnancy 03/26/2014    [facility]  PTA 04/09/2018, 6:45 PM  [facility] [address] [place], [place], [address] Phone:  [phone]   Fax:  [phone]  Name: Krista Mcintosh MRN: [id number] Date of Birth: 03-01-1976

## 2018-04-13 NOTE — Addendum Note (Signed)
Addended by: SHAVER, SUSAN E on: 04/13/2018 08:11 AM   Modules accepted: Orders

## 2018-04-29 ENCOUNTER — Encounter: Payer: Self-pay | Admitting: Physical Therapy

## 2018-04-29 ENCOUNTER — Ambulatory Visit: Payer: Medicaid Other | Attending: Physician Assistant | Admitting: Physical Therapy

## 2018-04-29 DIAGNOSIS — M6281 Muscle weakness (generalized): Secondary | ICD-10-CM | POA: Insufficient documentation

## 2018-04-29 DIAGNOSIS — M5441 Lumbago with sciatica, right side: Secondary | ICD-10-CM | POA: Insufficient documentation

## 2018-04-29 DIAGNOSIS — M62838 Other muscle spasm: Secondary | ICD-10-CM | POA: Diagnosis present

## 2018-04-29 DIAGNOSIS — G8929 Other chronic pain: Secondary | ICD-10-CM

## 2018-04-29 NOTE — Patient Instructions (Signed)
Issued from exercise drawer:  Supine trunk Stabilization exercises 2-3 x a week  5 to 10 reps,  1 to 5 second hold All issued except double arm/  hip Isometrics.

## 2018-04-29 NOTE — Therapy (Signed)
[place], [place], [address] Phone: [phone]   Fax:  [phone]  Physical Therapy Treatment  Patient Details  Name: Krista Mcintosh MRN: [id number] Date of Birth: 1976-02-09 Referring Provider (PT): Willeen Niece PA-C   Encounter Date: 04/29/2018  PT End of Session - 04/29/18 1306    Visit Number  5    Date for PT Re-Evaluation  04/08/18    Authorization Type  MCD    Authorization Time Period  2/5/202 through 05/12/2018    Authorization - Visit Number  1    Authorization - Number of Visits  4    PT Start Time  1150    PT Stop Time  1231    PT Time Calculation (min)  41 min    Behavior During Therapy  [facility] for tasks assessed/performed       Past Medical History:  Diagnosis Date  . Asthma     Past Surgical History:  Procedure Laterality Date  . TONSILLECTOMY      There were no vitals filed for this visit.  Subjective Assessment - 04/29/18 1153    Subjective  Does not know what increases pain.   i am doing the exercises.  i do feel stronger having  them.  I went to a massage therapist last week and it helped.   has been able to do a few short runs and it feels good.   i stretch after.  ( 35- 40 minutes).  i swim for  and hour at pool.      Pain Score  6    close to 10/10 yesterday   Pain Location  Hip    Pain Orientation  Right    Pain Descriptors / Indicators  Sharp;Throbbing   deep persistant   Pain Type  Chronic pain    Pain Radiating Towards  to knee,  anterior hip pain     Aggravating Factors   not sure    Pain Relieving Factors  ibuprophen ,  heating pad.  massage therapy.                        [facility] Adult PT Treatment/Exercise - 04/29/18 0001      Lumbar Exercises: Supine   Ab Set  5 reps    AB Set Limitations  with arms pressing into mat    Bent Knee Raise  5 reps;5 seconds    Bent Knee Raise Limitations  foot 3 inches from mat with abdominal bracing    Dead Bug Limitations  bent knee lift with opposite arm overhead    Bridge  10 reps    Other Supine Lumbar Exercises  5 reps abdominal set and walk out  and walk back  difficult,,   abdominal set withStatic straight leg lift 8-10 inches off mat,   Single knee/ hand isometric,  double hands to knees isometric      Lumbar Exercises: Prone   Other Prone Lumbar Exercises  heel press 5 X 5 seconds    Other Prone Lumbar Exercises  Multifitus press X 5,  press with knee flexion,  press with hip extension      Manual Therapy   Manual Therapy  --   P/A mobs hip in prone  right 5 reps.   This feels good.    Manual therapy comments  long axis distraction 5 x 10 right.      Soft tissue mobilization  instrument assist piriformis  PT Education - 04/29/18 1306    Education Details  HEP    Person(s) Educated  Patient    Methods  Explanation;Demonstration;Tactile cues;Verbal cues    Comprehension  Returned demonstration;Verbalized understanding       PT Short Term Goals - 04/29/18 1315      PT SHORT TERM GOAL #1   Title  I with initial HEP for hips and core9 04/08/2018)     Baseline  independent    Time  3    Period  Weeks    Status  Achieved      PT SHORT TERM GOAL #2   Title  perform single leg stance bilat without trendelenberg ( 04/08/2018)     Baseline  hip drop right    Time  3    Period  Weeks    Status  Partially Met      PT SHORT TERM GOAL #3   Title  increase bilat hip abduction =/> 5-/5 without pain ( 04/08/2018)     Baseline  5/5  no pain    Time  3    Period  Weeks    Status  On-going    Target Date  04/08/18      PT SHORT TERM GOAL #4   Title  demo strong contraction of multifidi ( 04/08/2018)     Baseline  good contraction,  endurance improving    Time  3    Period  Weeks    Status  On-going      PT SHORT TERM GOAL #5   Baseline  not decreased    Time  3    Period  Weeks    Status  On-going        PT Long Term Goals - 03/18/18 1456      PT  LONG TERM GOAL #1   Title  to be established at renewal to include a running goal     Baseline  has stopped running    Time  3    Period  Weeks    Status  New    Target Date  04/08/18            Plan - 04/29/18 1310    Clinical Impression Statement  Progress toward goals:  STG#3 met,  STG#2 partially met no trendelenberg on left.  Overall her pain has not decreased.  She has returned to running 45 minutes 3 x a week and it feels good.   Patient declined the need for modalities for pain.     PT Next Visit Plan   Update renewal date in  in chart.   Review hip exercise,  check for calf tightness.  needs ADL Education,  lifting when ready    PT Home Exercise Plan  JOSPT hip strengthening.,  LTR,  Basic back.  back stabilization exercise. with abdominals.  bent knee hold,  SLR hold,  march back and out,  arm reach overhead, isometric hip,  arm press into mat    Consulted and Agree with Plan of Care  Patient       Patient will benefit from skilled therapeutic intervention in order to improve the following deficits and impairments:     Visit Diagnosis: Chronic right-sided low back pain with right-sided sciatica  Muscle weakness (generalized)  Other muscle spasm     Problem List Patient Active Problem List   Diagnosis Date Noted  . Persistent cough for 3 weeks or longer 03/26/2014  . [redacted] weeks gestation of pregnancy  03/26/2014    Krista Mcintosh  PTA 04/29/2018, 1:18 PM  [facility] [address] [place], [place], [address] Phone: [phone]   Fax:  [phone]  Name: Krista Mcintosh MRN: [id number] Date of Birth: 15-Mar-1975

## 2018-04-30 NOTE — Therapy (Signed)
[place], [place], [address] Phone: [phone]   Fax:  [phone]  Physical Therapy Treatment  Patient Details  Name: Krista Mcintosh MRN: [id number] Date of Birth: 05/09/1975 Referring Provider (PT): Willeen Niece PA-C   Encounter Date: 04/29/2018  PT End of Session - 04/29/18 1306    Visit Number  5    Date for PT Re-Evaluation  04/08/18    Authorization Type  MCD    Authorization Time Period  2/5/202 through 05/12/2018    Authorization - Visit Number  1    Authorization - Number of Visits  4    PT Start Time  1150    PT Stop Time  1231    PT Time Calculation (min)  41 min    Behavior During Therapy  [facility] for tasks assessed/performed       Past Medical History:  Diagnosis Date  . Asthma     Past Surgical History:  Procedure Laterality Date  . TONSILLECTOMY      There were no vitals filed for this visit.  Subjective Assessment - 04/29/18 1153    Subjective  Does not know what increases pain.   i am doing the exercises.  i do feel stronger having  them.  I went to a massage therapist last week and it helped.   has been able to do a few short runs and it feels good.   i stretch after.  ( 35- 40 minutes).  i swim for  and hour at pool.      Pain Score  6    close to 10/10 yesterday   Pain Location  Hip    Pain Orientation  Right    Pain Descriptors / Indicators  Sharp;Throbbing   deep persistant   Pain Type  Chronic pain    Pain Radiating Towards  to knee,  anterior hip pain     Aggravating Factors   not sure    Pain Relieving Factors  ibuprophen ,  heating pad.  massage therapy.                                PT Education - 04/29/18 1306    Education Details  HEP    Person(s) Educated  Patient    Methods  Explanation;Demonstration;Tactile cues;Verbal cues    Comprehension  Returned demonstration;Verbalized understanding       PT Short Term Goals - 04/29/18  1315      PT SHORT TERM GOAL #1   Title  I with initial HEP for hips and core9 04/08/2018)     Baseline  independent    Time  3    Period  Weeks    Status  Achieved      PT SHORT TERM GOAL #2   Title  perform single leg stance bilat without trendelenberg ( 04/08/2018)     Baseline  hip drop right    Time  3    Period  Weeks    Status  Partially Met      PT SHORT TERM GOAL #3   Title  increase bilat hip abduction =/> 5-/5 without pain ( 04/08/2018)     Baseline  5/5  no pain    Time  3    Period  Weeks    Status  On-going    Target Date  04/08/18      PT SHORT TERM GOAL #  4   Title  demo strong contraction of multifidi ( 04/08/2018)     Baseline  good contraction,  endurance improving    Time  3    Period  Weeks    Status  On-going      PT SHORT TERM GOAL #5   Baseline  not decreased    Time  3    Period  Weeks    Status  On-going        PT Long Term Goals - 03/18/18 1456      PT LONG TERM GOAL #1   Title  to be established at renewal to include a running goal     Baseline  has stopped running    Time  3    Period  Weeks    Status  New    Target Date  04/08/18            Plan - 04/29/18 1310    Clinical Impression Statement  Progress toward goals:  STG#3 met,  STG#2 partially met no trendelenberg on left.  Overall her pain has not decreased.  She has returned to running 45 minutes 3 x a week and it feels good.   Patient declined the need for modalities for pain.     PT Next Visit Plan   Update renewal date in  in chart.   Review hip exercise,  check for calf tightness.  needs ADL Education,  lifting when ready    PT Home Exercise Plan  JOSPT hip strengthening.,  LTR,  Basic back.  back stabilization exercise. with abdominals.  bent knee hold,  SLR hold,  march back and out,  arm reach overhead, isometric hip,  arm press into mat    Consulted and Agree with Plan of Care  Patient       Patient will benefit from skilled therapeutic intervention in order to  improve the following deficits and impairments:     Visit Diagnosis: Chronic right-sided low back pain with right-sided sciatica  Muscle weakness (generalized)  Other muscle spasm     Problem List Patient Active Problem List   Diagnosis Date Noted  . Persistent cough for 3 weeks or longer 03/26/2014  . [redacted] weeks gestation of pregnancy 03/26/2014    [facility]  PTA 04/30/2018, 2:08 PM  [facility] [address] [place], [place], [address] Phone: [phone]   Fax:  [phone]  Name: Krista Mcintosh MRN: [id number] Date of Birth: Sep 29, 1975

## 2018-05-06 ENCOUNTER — Ambulatory Visit: Payer: Medicaid Other | Admitting: Physical Therapy

## 2018-05-06 ENCOUNTER — Encounter: Payer: Self-pay | Admitting: Physical Therapy

## 2018-05-06 DIAGNOSIS — M5441 Lumbago with sciatica, right side: Secondary | ICD-10-CM | POA: Diagnosis not present

## 2018-05-06 DIAGNOSIS — M62838 Other muscle spasm: Secondary | ICD-10-CM

## 2018-05-06 DIAGNOSIS — G8929 Other chronic pain: Secondary | ICD-10-CM

## 2018-05-06 DIAGNOSIS — M6281 Muscle weakness (generalized): Secondary | ICD-10-CM

## 2018-05-06 NOTE — Therapy (Signed)
[place], [place], [address] Phone: [phone]   Fax:  [phone]  Physical Therapy Treatment  Patient Details  Name: Krista Mcintosh MRN: [id number] Date of Birth: 08-05-1975 Referring Provider (PT): Willeen Niece PA-C   Encounter Date: 05/06/2018  PT End of Session - 05/06/18 1149    Visit Number  6    Number of Visits  8    Date for PT Re-Evaluation  05/08/18    Authorization Type  MCD    Authorization Time Period  4 visits through 05/12/2018    Authorization - Visit Number  2    Authorization - Number of Visits  4    PT Start Time  1150    PT Stop Time  8889    PT Time Calculation (min)  58 min    Activity Tolerance  Patient tolerated treatment well       Past Medical History:  Diagnosis Date  . Asthma     Past Surgical History:  Procedure Laterality Date  . TONSILLECTOMY      There were no vitals filed for this visit.  Subjective Assessment - 05/06/18 1151    Subjective  Pt reports that she feels stronger however no change in her pain level, however today is better. She has not run recently d/t a cold.      Patient Stated Goals  not be in pain and get back to runningand get off medication     Currently in Pain?  Yes    Pain Score  2     Pain Location  Back    Pain Orientation  Right    Pain Descriptors / Indicators  Dull    Pain Type  Chronic pain    Pain Radiating Towards  into Rt hip    Pain Frequency  Constant    Aggravating Factors   not sure    Pain Relieving Factors  OTC meds                       [facility] Adult PT Treatment/Exercise - 05/06/18 0001      Modalities   Modalities  Electrical Stimulation;Moist Heat      Moist Heat Therapy   Number Minutes Moist Heat  15 Minutes    Moist Heat Location  Lumbar Spine   Rt     Electrical Stimulation   Electrical Stimulation Location  Rt lumbar and QL    Electrical Stimulation Action  IFC    Electrical  Stimulation Parameters  to tolerance    Electrical Stimulation Goals  Tone;Pain      Manual Therapy   Manual Therapy  Soft tissue mobilization;Joint mobilization    Manual therapy comments  skilled palpation during DN     Joint Mobilization  lumbar grade III CPA with good mobility, Rt UPA hypomobile and tender at L3-4    Soft tissue mobilization  STM to Rt lumbar paraspinals QL, lower lats, gluts and piriformins       Trigger Point Dry Needling - 05/06/18 1156    Consent Given?  Yes    Education Handout Provided  No    Muscles Treated Upper Body  Longissimus;Quadratus Lumborum   Rt sdie   Muscles Treated Lower Body  Piriformis;Gluteus maximus    Longissimus Response  Palpable increased muscle length;Twitch response elicited   Rt side V6-X45   Gluteus Maximus Response  Palpable increased muscle length;Twitch response elicited   Rt with stim  Piriformis Response  Palpable increased muscle length;Twitch response elicited   Rt with stim            PT Short Term Goals - 04/29/18 1315      PT SHORT TERM GOAL #1   Title  I with initial HEP for hips and core9 04/08/2018)     Baseline  independent    Time  3    Period  Weeks    Status  Achieved      PT SHORT TERM GOAL #2   Title  perform single leg stance bilat without trendelenberg ( 04/08/2018)     Baseline  hip drop right    Time  3    Period  Weeks    Status  Partially Met      PT SHORT TERM GOAL #3   Title  increase bilat hip abduction =/> 5-/5 without pain ( 04/08/2018)     Baseline  5/5  no pain    Time  3    Period  Weeks    Status  On-going    Target Date  04/08/18      PT SHORT TERM GOAL #4   Title  demo strong contraction of multifidi ( 04/08/2018)     Baseline  good contraction,  endurance improving    Time  3    Period  Weeks    Status  On-going      PT SHORT TERM GOAL #5   Baseline  not decreased    Time  3    Period  Weeks    Status  On-going        PT Long Term Goals - 03/18/18 1456       PT LONG TERM GOAL #1   Title  to be established at renewal to include a running goal     Baseline  has stopped running    Time  3    Period  Weeks    Status  New    Target Date  04/08/18            Plan - 05/06/18 1239    Clinical Impression Statement  Pt was very tight in the Rt QL, had good releases with DN and manual work, the lumbar mulitifidi and gluts were not as tight as last visit.  Discussed trying this visit and if still no change in her pain stopping PT and having her perform HEP for more strength and return to MD     Rehab Potential  Excellent    PT Frequency  1x / week    PT Duration  4 weeks    PT Treatment/Interventions  Iontophoresis 35m/ml Dexamethasone;Neuromuscular re-education;Moist Heat;Dry needling;Manual techniques;Spinal Manipulations;Patient/family education;Cryotherapy;Electrical Stimulation;Therapeutic exercise;Taping    PT Next Visit Plan  ERO     Consulted and Agree with Plan of Care  Patient       Patient will benefit from skilled therapeutic intervention in order to improve the following deficits and impairments:  Pain, Improper body mechanics, Increased muscle spasms, Decreased strength, Hypomobility  Visit Diagnosis: Chronic right-sided low back pain with right-sided sciatica  Muscle weakness (generalized)  Other muscle spasm     Problem List Patient Active Problem List   Diagnosis Date Noted  . Persistent cough for 3 weeks or longer 03/26/2014  . [redacted] weeks gestation of pregnancy 03/26/2014    SJeral PinchPT  05/06/2018, 12:41 PM  [facility] [address]Phone: [phone]  Fax:  [phone]  Name: Krista Mcintosh MRN: [id number] Date of Birth: Dec 02, 1975

## 2018-05-13 ENCOUNTER — Encounter: Payer: Self-pay | Admitting: Physical Therapy

## 2018-05-13 ENCOUNTER — Ambulatory Visit: Payer: Medicaid Other | Attending: Physician Assistant | Admitting: Physical Therapy

## 2018-05-13 DIAGNOSIS — M6281 Muscle weakness (generalized): Secondary | ICD-10-CM | POA: Diagnosis present

## 2018-05-13 DIAGNOSIS — M5441 Lumbago with sciatica, right side: Secondary | ICD-10-CM | POA: Insufficient documentation

## 2018-05-13 DIAGNOSIS — M62838 Other muscle spasm: Secondary | ICD-10-CM | POA: Diagnosis present

## 2018-05-13 DIAGNOSIS — G8929 Other chronic pain: Secondary | ICD-10-CM | POA: Insufficient documentation

## 2018-05-13 NOTE — Therapy (Signed)
[place], [place], [address] Phone: [phone]   Fax:  [phone]  Physical Therapy Treatment  Patient Details  Name: Krista Mcintosh MRN: [id number] Date of Birth: [birth year] Referring Provider (PT): Willeen Niece PA-C   Encounter Date: 05/13/2018  PT End of Session - 05/13/18 1148    Visit Number  7    Number of Visits  11   Date for PT Re-Evaluation  06/10/18    Authorization Type  MCD    Authorization Time Period  4 visits through 05/12/2018    PT Start Time  1148    PT Stop Time  1247    PT Time Calculation (min)  59 min    Activity Tolerance  Patient tolerated treatment well       Past Medical History:  Diagnosis Date  . Asthma     Past Surgical History:  Procedure Laterality Date  . TONSILLECTOMY      There were no vitals filed for this visit.  Subjective Assessment - 05/13/18 1148    Subjective  Pt reports she was " great" until Monday and then bent over and the pain returned and her Rt sided siezed up.  It was the worse she has ever been.  Not able to find any position of comfort.  Yesterday she took some alieve and today is better.  The pain is different no - inot the groin area and top of quad.     Pertinent History  has had three children, currently swims laps when able, reduced her running.      Currently in Pain?  Yes    Pain Score  5     Pain Location  Hip    Pain Orientation  Right    Pain Descriptors / Indicators  Spasm;Sharp    Pain Type  Chronic pain    Pain Radiating Towards  into Rt groin    Aggravating Factors   bending with a twist    Pain Relieving Factors  heat and OTC meds.          [facility] PT Assessment - 05/13/18 0001      Assessment   Medical Diagnosis  Rt sciatica and low back pain    Referring Provider (PT)  Willeen Niece PA-C    Onset Date/Surgical Date  11/16/17    Hand Dominance  Right      Balance Screen   How many times?  --   no falls since  beginning PT     Observation/Other Assessments   Oswestry Disability Index   31/50, 60%       Functional Tests   Functional tests  Single leg stance      AROM   Lumbar Flexion  to floor    Lumbar Extension  75% present    Lumbar - Right Side Bend  WNL    Lumbar - Left Side Bend  75% present    Lumbar - Right Rotation  WNL    Lumbar - Left Rotation  WNL      Strength   Right Hip Flexion  5/5    Right Hip ABduction  5/5    Lumbar Flexion  --   TA fair   Lumbar Extension  --   multifidigood                  [facility] Adult PT Treatment/Exercise - 05/13/18 0001      Lumbar Exercises: Stretches   Lower Trunk Rotation  20 seconds;2 reps      Lumbar Exercises: Quadruped   Madcat/Old Horse  10 reps      Manual Therapy   Manual Therapy  Soft tissue mobilization;Joint mobilization    Manual therapy comments  skilled palpation during DN     Soft tissue mobilization  STM to Rt lumbar paraspinals QL, lower lats, gluts and piriformins       Trigger Point Dry Needling - 05/13/18 0001    Consent Given?  Yes    Education Handout Provided  No    Muscles Treated Back/Hip  Quadratus lumborum;Gluteus maximus;Gluteus medius;Piriformis;Erector spinae    Longissimus Response  Palpable increased muscle length;Twitch response elicited   Rt F7-9   Gluteus Minimus Response  Palpable increased muscle length;Twitch response elicited   Rt   Gluteus Medius Response  Palpable increased muscle length;Twitch response elicited   Rt   Gluteus Maximus Response  Palpable increased muscle length;Twitch response elicited    Piriformis Response  Palpable increased muscle length;Twitch response elicited   Rt            PT Short Term Goals - 05/13/18 1222      PT SHORT TERM GOAL #1   Title  I with initial HEP for hips and core9 04/08/2018)     Status  Achieved      PT SHORT TERM GOAL #2   Title  perform single leg stance bilat without trendelenberg ( 04/08/2018)     Status  Achieved       PT SHORT TERM GOAL #3   Title  increase bilat hip abduction =/> 5-/5 without pain ( 04/08/2018)     Status  Achieved      PT SHORT TERM GOAL #4   Title  demo strong contraction of multifidi ( 04/08/2018)     Status  Achieved      PT SHORT TERM GOAL #5   Title  report overall decrease of pain =/> 50% ( 04/08/2018)     Baseline  pt is 25% better, was 75% last week however bent over with a twist and flared up Rt sided low back pain.     Time  4    Period  Weeks    Status  On-going    Target Date  06/10/18        PT Long Term Goals - 05/13/18 1224      PT LONG TERM GOAL #1   Title  tolerate running a 5K with no more than 2/10 pain the next day    Baseline  has significant pain the day after running up to 7/10    Time  4    Period  Weeks    Status  New    Target Date  06/10/18      PT LONG TERM GOAL #2   Title  tolerate IADLs that involve trunk twisting without having a flare up, keeping pain below 2/10    Baseline  Pt with flare up of 9/10 two days ago after bending and twisting.     Time  4    Period  Weeks    Status  New    Target Date  06/10/18      PT LONG TERM GOAL #3   Title  pick up  and carry her children without having Rt sided low back pain ~ 30#    Baseline  unable to pick up her daughter without increasing her back pain    Time  4  Period  Weeks    Status  New    Target Date  06/10/18            Plan - 05/13/18 1240    Clinical Impression Statement  Keyly has gotten stronger in her hips and core. Still has some core weakness.  She had been doing well after her last tx, then she bent over and tightned up the whole Rt side low back and was in severe pain. She has not been able to return to running without pain and has pain with lifting/carrying her youngest child.  She is very tight again in the Rt QL, low back and gluts,   She should respond well to manual work again.Marland Kitchen she would benefit from continued tx to relax her back spasms, improve core  stabilization to allow her to return to her PLOF.  She is making progress, has met almost all her STGs and LTGs have been added.     Rehab Potential  Excellent    PT Frequency  1x / week    PT Duration  4 weeks    PT Treatment/Interventions  Iontophoresis 55m/ml Dexamethasone;Neuromuscular re-education;Moist Heat;Dry needling;Manual techniques;Spinal Manipulations;Patient/family education;Cryotherapy;Electrical Stimulation;Therapeutic exercise;Taping;Ultrasound    PT Next Visit Plan  assess response to DN and manual work, core stabilization    Consulted and Agree with Plan of Care  Patient       Patient will benefit from skilled therapeutic intervention in order to improve the following deficits and impairments:  Pain, Improper body mechanics, Increased muscle spasms, Decreased strength, Hypomobility  Visit Diagnosis: Chronic right-sided low back pain with right-sided sciatica - Plan: PT plan of care cert/re-cert  Muscle weakness (generalized) - Plan: PT plan of care cert/re-cert  Other muscle spasm - Plan: PT plan of care cert/re-cert     Problem List Patient Active Problem List   Diagnosis Date Noted  . Persistent cough for 3 weeks or longer 03/26/2014  . [redacted] weeks gestation of pregnancy 03/26/2014    SJeral PinchPT  05/13/2018, 12:53 PM  [facility] [address]Phone: [phone]  Fax:  [phone] Name: ALATANGELA MCCOMASMRN: [id number] of Birth: [birth year]

## 2018-05-20 ENCOUNTER — Encounter: Payer: Self-pay | Admitting: Physical Therapy

## 2018-05-20 ENCOUNTER — Ambulatory Visit: Payer: Medicaid Other | Admitting: Physical Therapy

## 2018-05-20 ENCOUNTER — Other Ambulatory Visit: Payer: Self-pay

## 2018-05-20 DIAGNOSIS — M62838 Other muscle spasm: Secondary | ICD-10-CM

## 2018-05-20 DIAGNOSIS — M5441 Lumbago with sciatica, right side: Principal | ICD-10-CM

## 2018-05-20 DIAGNOSIS — M6281 Muscle weakness (generalized): Secondary | ICD-10-CM

## 2018-05-20 DIAGNOSIS — G8929 Other chronic pain: Secondary | ICD-10-CM

## 2018-05-20 NOTE — Therapy (Signed)
[facility] [address] [place], [place], [address] Phone: [phone]   Fax:  [phone]  Physical Therapy Treatment  Patient Details  Name: Krista Mcintosh MRN: [id number] Date of Birth: 1975-10-31 Referring Provider (PT): Maud Deed PA-C   Encounter Date: 05/20/2018  PT End of Session - 05/20/18 1157    Visit Number  8    Number of Visits  11    Date for PT Re-Evaluation  06/10/18    Authorization Type  MCD    Authorization Time Period  4 visits through 05/12/2018 - requested an additional 4 visits 05/13/2018    PT Start Time  1157   in late   PT Stop Time  1232    PT Time Calculation (min)  35 min    Activity Tolerance  Patient tolerated treatment well    Behavior During Therapy  [facility] for tasks assessed/performed       Past Medical History:  Diagnosis Date  . Asthma     Past Surgical History:  Procedure Laterality Date  . TONSILLECTOMY      There were no vitals filed for this visit.  Subjective Assessment - 05/20/18 1158    Subjective  Pt was sore for a couple days after DN overall feels better some, does have a cold now, limiting cardio from this, cont with ex , hasn't needed OTC for 2 days.     Patient Stated Goals  not be in pain and get back to runningand get off medication     Currently in Pain?  Yes    Pain Score  3     Pain Location  Back    Pain Orientation  Right    Pain Descriptors / Indicators  Aching    Pain Onset  More than a month ago    Pain Frequency  Constant    Aggravating Factors   benfding and twisting - has been limiting this                       [facility] Adult PT Treatment/Exercise - 05/20/18 0001      Lumbar Exercises: Stretches   Single Knee to Chest Stretch  Left;Right;30 seconds    Single Knee to Chest Stretch Limitations  followed by knee to opposite shoulder      Lumbar Exercises: Standing   Other Standing Lumbar Exercises  dead lift form practice and  then5x5 dead lifts with 30#      Lumbar Exercises: Seated   Other Seated Lumbar Exercises  Rt LE nerve flossing      Manual Therapy   Manual Therapy  Myofascial release;Manual Traction    Joint Mobilization  prone Rt LE off EOB, Rt UPA mob at L4 with patient bending and straightening Rt knee , and s/l lumbar mobs with lumbrical grip to improve spinal mobility    Myofascial Release  to Rt LE nerve flossing at hip/knee /ankle in supine     Manual Traction  Rt LE long axis traction Rt LE               PT Short Term Goals - 05/13/18 1222      PT SHORT TERM GOAL #1   Title  I with initial HEP for hips and core9 04/08/2018)     Status  Achieved      PT SHORT TERM GOAL #2   Title  perform single leg stance bilat without trendelenberg ( 04/08/2018)  Status  Achieved      PT SHORT TERM GOAL #3   Title  increase bilat hip abduction =/> 5-/5 without pain ( 04/08/2018)     Status  Achieved      PT SHORT TERM GOAL #4   Title  demo strong contraction of multifidi ( 04/08/2018)     Status  Achieved      PT SHORT TERM GOAL #5   Title  report overall decrease of pain =/> 50% ( 04/08/2018)     Baseline  pt is 25% better, was 75% last week however bent over with a twist and flared up Rt sided low back pain.     Time  4    Period  Weeks    Status  On-going    Target Date  06/10/18        PT Long Term Goals - 05/13/18 1224      PT LONG TERM GOAL #1   Title  tolerate running a 5K with no more than 2/10 pain the next day    Baseline  has significant pain the day after running up to 7/10    Time  4    Period  Weeks    Status  New    Target Date  06/10/18      PT LONG TERM GOAL #2   Title  tolerate IADLs that involve trunk twisting without having a flare up, keeping pain below 2/10    Baseline  Pt with flare up of 9/10 two days ago after bending and twisting.     Time  4    Period  Weeks    Status  New    Target Date  06/10/18      PT LONG TERM GOAL #3   Title  pick up  and  carry her children without having Rt sided low back pain ~ 30#    Baseline  unable to pick up her daughter without increasing her back pain    Time  4    Period  Weeks    Status  New    Target Date  06/10/18            Plan - 05/20/18 1327    Clinical Impression Statement  Krista Mcintosh is making slow progress, added in neural glides for Rt LE today, she has high level nerve impingement in the Rt LE when placed on stretch.  still having some hypomobility in her lumbar spine as well.  would benefit form more mobs.     Rehab Potential  Excellent    PT Frequency  1x / week    PT Treatment/Interventions  Iontophoresis /ml Dexamethasone;Neuromuscular re-education;Moist Heat;Dry needling;Manual techniques;Spinal Manipulations;Patient/family education;Cryotherapy;Electrical Stimulation;Therapeutic exercise;Taping;Ultrasound    PT Next Visit Plan  spinal mobs and nerve glide mobs     Consulted and Agree with Plan of Care  Patient       Patient will benefit from skilled therapeutic intervention in order to improve the following deficits and impairments:  Pain, Improper body mechanics, Increased muscle spasms, Decreased strength, Hypomobility  Visit Diagnosis: Chronic right-sided low back pain with right-sided sciatica  Muscle weakness (generalized)  Other muscle spasm     Problem List Patient Active Problem List   Diagnosis Date Noted  . Persistent cough for 3 weeks or longer 03/26/2014  . [redacted] weeks gestation of pregnancy 03/26/2014    Roderic Scarce PT  05/20/2018, 1:29 PM  [facility] [address] [place], [place],  [address] Phone: [phone]   Fax:  [phone]  Name: Krista Mcintosh MRN: [id number] Date of Birth: 04-Jan-1976

## 2018-05-21 IMAGING — DX DG HIP (WITH OR WITHOUT PELVIS) 2-3V*R*
3 series · 3 of 3 positions shown · non-contrast
Comparison: None.

CLINICAL DATA: Right hip pain, no known injury, initial encounter

EXAM:
DG HIP (WITH OR WITHOUT PELVIS) 2-3V RIGHT

[pelvis ap]
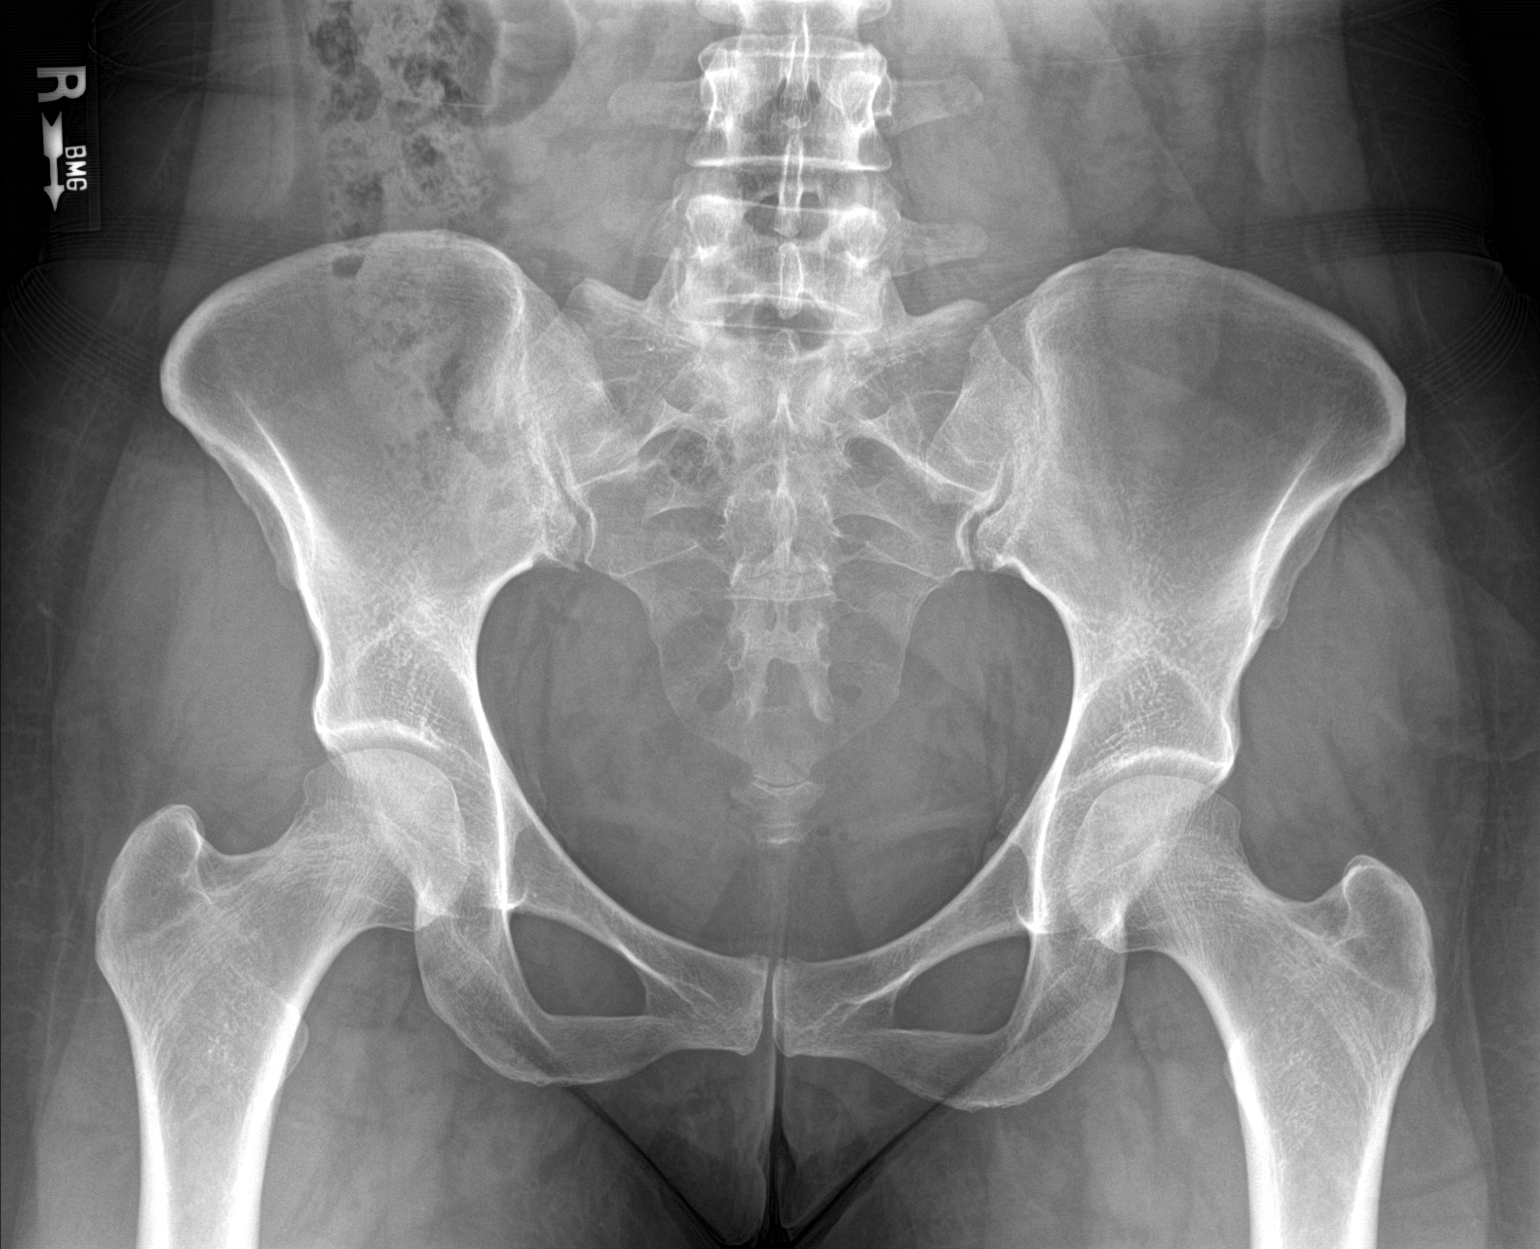

[hip ap]
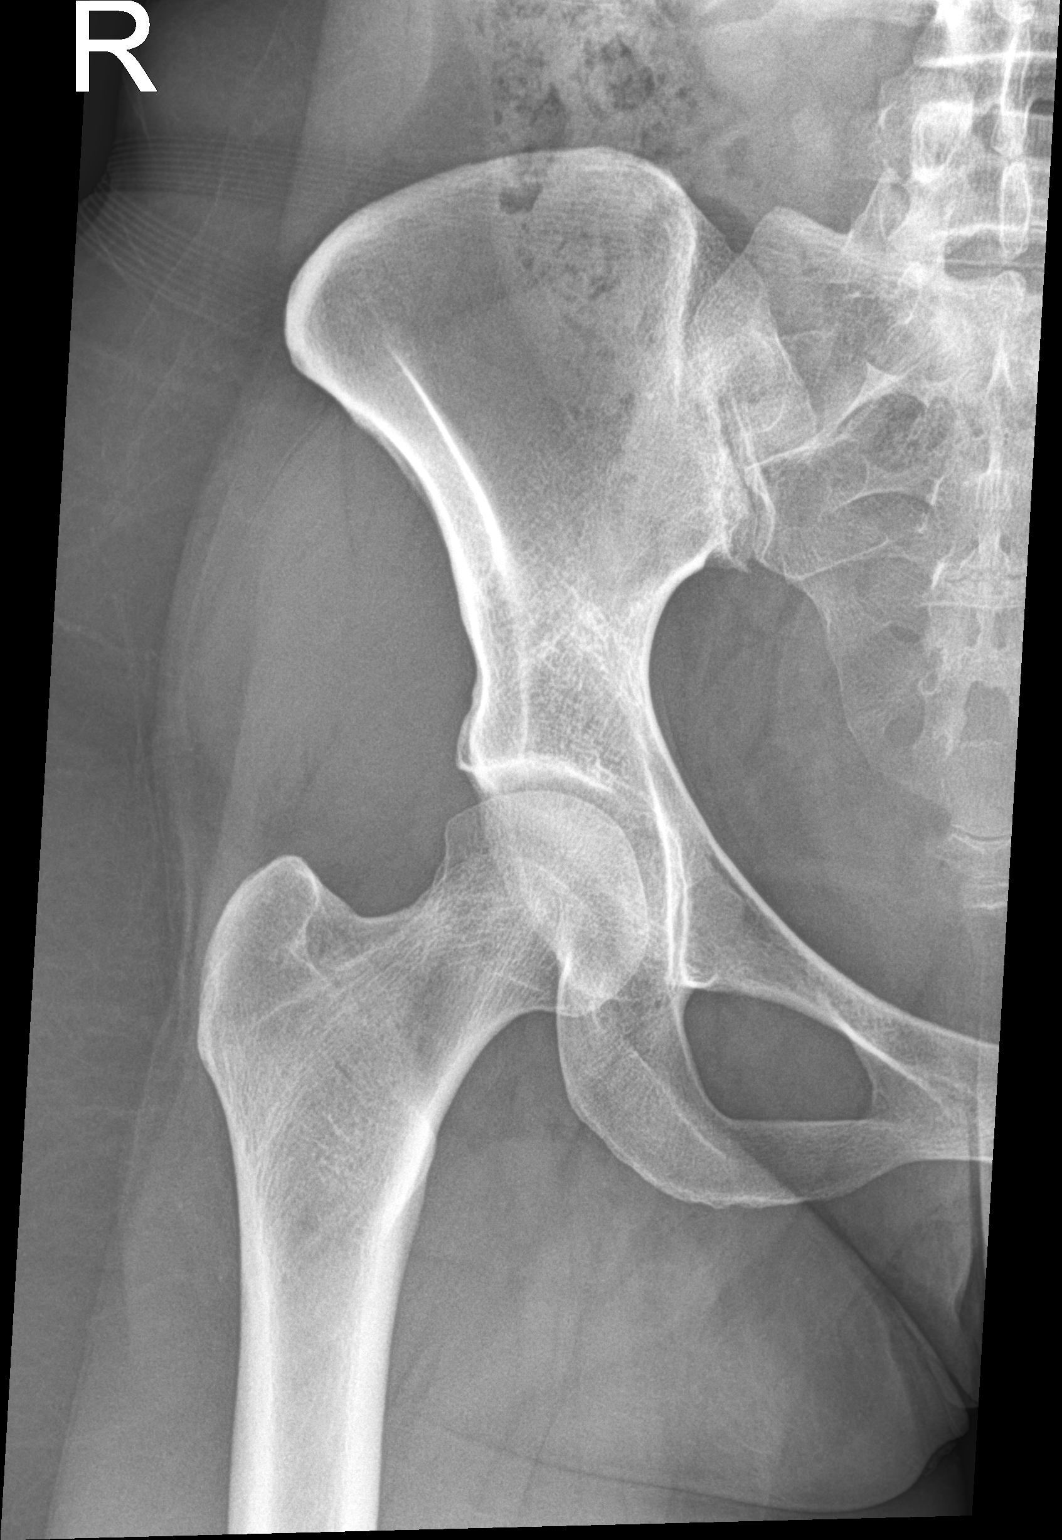

[hip lat]
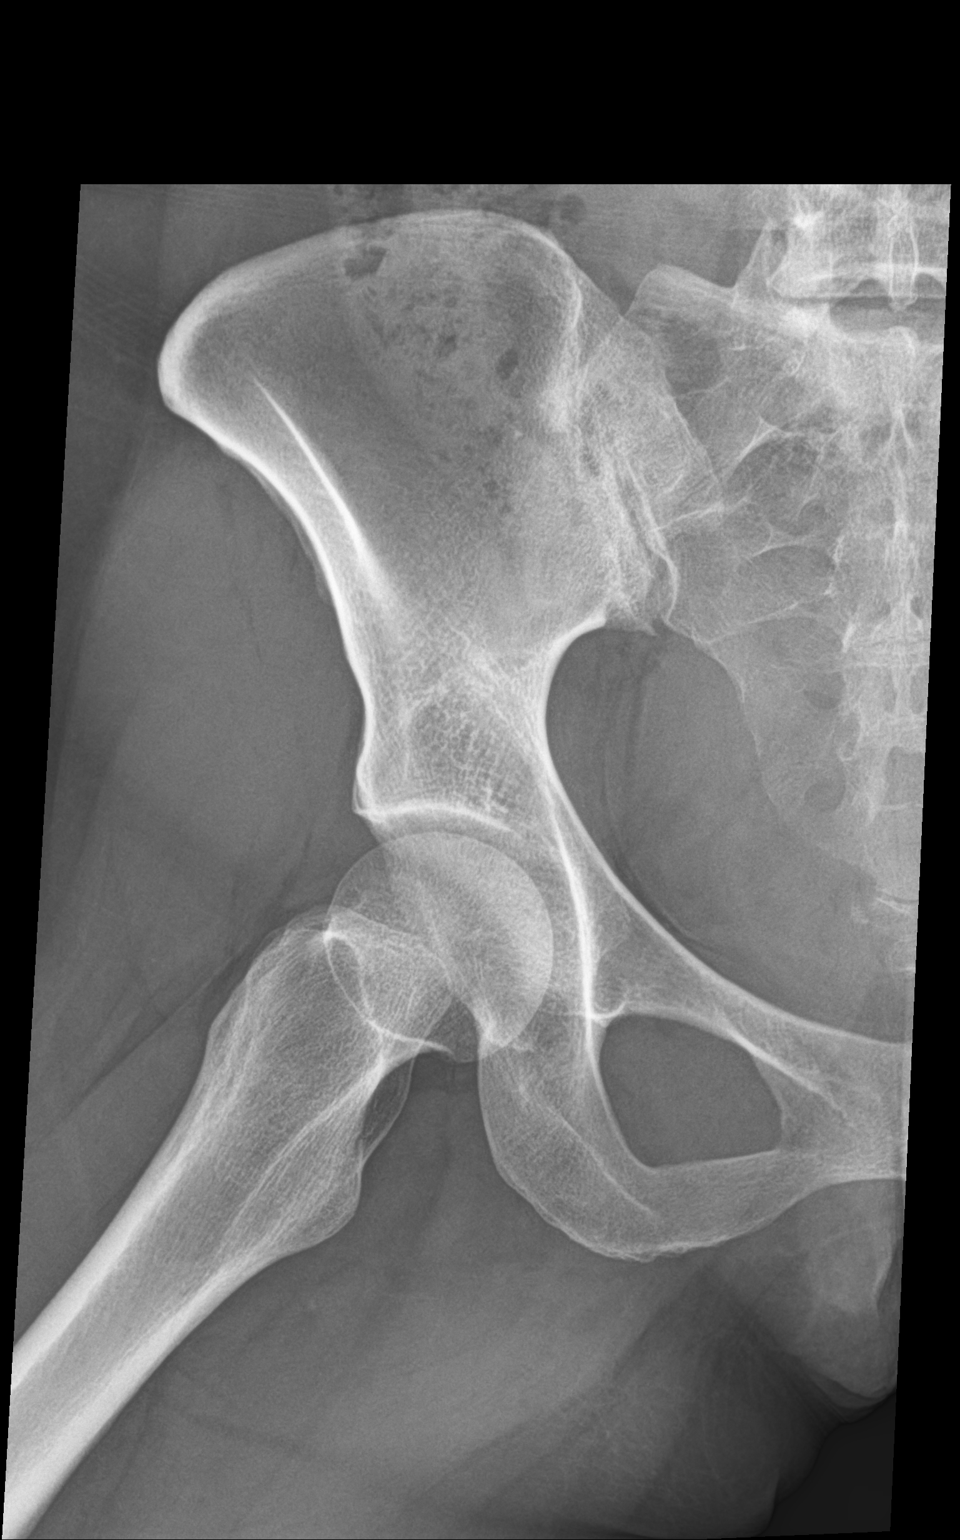

[3 of 3 positions shown; findings below may reference images not displayed]

FINDINGS: Pelvic ring is intact. No acute fracture or dislocation is seen. No
soft tissue abnormality is noted.
IMPRESSION: No acute abnormality noted.

## 2018-05-25 ENCOUNTER — Ambulatory Visit: Payer: Medicaid Other | Admitting: Physical Therapy

## 2018-05-25 ENCOUNTER — Other Ambulatory Visit: Payer: Self-pay

## 2018-05-25 ENCOUNTER — Encounter: Payer: Self-pay | Admitting: Physical Therapy

## 2018-05-25 DIAGNOSIS — G8929 Other chronic pain: Secondary | ICD-10-CM

## 2018-05-25 DIAGNOSIS — M5441 Lumbago with sciatica, right side: Principal | ICD-10-CM

## 2018-05-25 DIAGNOSIS — M6281 Muscle weakness (generalized): Secondary | ICD-10-CM

## 2018-05-25 DIAGNOSIS — M62838 Other muscle spasm: Secondary | ICD-10-CM

## 2018-05-25 NOTE — Therapy (Addendum)
[place], [place], [address] Phone: [phone]   Fax:  [phone]  Physical Therapy Treatment  Patient Details  Name: Krista Mcintosh MRN: [id number] Date of Birth: [birth year] Referring Provider (PT): Willeen Niece PA-C   Encounter Date: 05/25/2018  PT End of Session - 05/25/18 1127    Visit Number  9    Number of Visits  11    Date for PT Re-Evaluation  06/10/18    Authorization Type  MCD    Authorization Time Period  4 visits through 05/12/2018 - requested an additional 4 visits 05/13/2018    PT Start Time  1102    PT Stop Time  1142    PT Time Calculation (min)  40 min       Past Medical History:  Diagnosis Date  . Asthma     Past Surgical History:  Procedure Laterality Date  . TONSILLECTOMY      There were no vitals filed for this visit.  Subjective Assessment - 05/25/18 1104    Subjective  Ran/walked about 4 miles yesterday. Stretched afterward and put heat. I feel pain at 2-3/10, its like my baseline now.     Currently in Pain?  Yes    Pain Score  3     Pain Location  Back    Pain Descriptors / Indicators  Aching                       [facility] Adult PT Treatment/Exercise - 05/25/18 0001      Lumbar Exercises: Stretches   Single Knee to Chest Stretch  Left;Right;30 seconds    Single Knee to Chest Stretch Limitations  followed by knee to opposite shoulder      Lumbar Exercises: Standing   Side Lunge Limitations  lateral squats around mat table 1/2 x 2 each way blue band at knees     Other Standing Lumbar Exercises  standing therabands redband horizontal adduction in staggered stand, narrow stand, palloff press red band double 10 x 2 each way     Other Standing Lumbar Exercises  20# goblet squat 10 x 2, 30# deadlifts 10 x 2      Lumbar Exercises: Supine   Clam  10 reps    Clam Limitations  blue alternating     Bridge with clamshell  20 reps   blue     Lumbar  Exercises: Sidelying   Other Sidelying Lumbar Exercises  off edge of mat RLE ER x 20       Manual Therapy   Manual Traction  Rt LE long axis traction Rt LE               PT Short Term Goals - 05/13/18 1222      PT SHORT TERM GOAL #1   Title  I with initial HEP for hips and core9 04/08/2018)     Status  Achieved      PT SHORT TERM GOAL #2   Title  perform single leg stance bilat without trendelenberg ( 04/08/2018)     Status  Achieved      PT SHORT TERM GOAL #3   Title  increase bilat hip abduction =/> 5-/5 without pain ( 04/08/2018)     Status  Achieved      PT SHORT TERM GOAL #4   Title  demo strong contraction of multifidi ( 04/08/2018)     Status  Achieved  PT SHORT TERM GOAL #5   Title  report overall decrease of pain =/> 50% ( 04/08/2018)     Baseline  pt is 25% better, was 75% last week however bent over with a twist and flared up Rt sided low back pain.     Time  4    Period  Weeks    Status  On-going    Target Date  06/10/18        PT Long Term Goals - 05/25/18 1138      PT LONG TERM GOAL #1   Title  tolerate running a 5K with no more than 2/10 pain the next day    Baseline  ran with 48 year old son for 4 miles and has 2-3/10 pain    Time  4    Period  Weeks    Status  Partially Met      PT LONG TERM GOAL #2   Title  tolerate IADLs that involve trunk twisting without having a flare up, keeping pain below 2/10    Baseline  its been 2 weeks since last flare up     Time  4    Period  Weeks    Status  On-going      PT LONG TERM GOAL #3   Title  pick up  and carry her children without having Rt sided low back pain ~ 30#    Baseline  some improvement     Time  4    Period  Weeks    Status  On-going            Plan - 05/25/18 1148    Clinical Impression Statement  Pt reports good tolearance to run yesterday with her baselin now about 2-3/10. Worked on standing core, rotatinal stabilization kettle bell lifting, Very challenged by lateral band  squats.     PT Next Visit Plan  spinal mobs and nerve glide mobs     PT Home Exercise Plan  JOSPT hip strengthening.,  LTR,  Basic back.  back stabilization exercise. with abdominals.  bent knee hold,  SLR hold,  march back and out,  arm reach overhead, isometric hip,  arm press into mat    Consulted and Agree with Plan of Care  Patient       Patient will benefit from skilled therapeutic intervention in order to improve the following deficits and impairments:  Pain, Improper body mechanics, Increased muscle spasms, Decreased strength, Hypomobility  Visit Diagnosis: Chronic right-sided low back pain with right-sided sciatica  Muscle weakness (generalized)  Other muscle spasm     Problem List Patient Active Problem List   Diagnosis Date Noted  . Persistent cough for 3 weeks or longer 03/26/2014  . [redacted] weeks gestation of pregnancy 03/26/2014    Dorene Ar, PTA 05/25/2018, 11:49 AM  [place], [place], [address] Phone: [phone]   Fax:  [phone]  Name: Krista Mcintosh MRN: [id number] Date of Birth: Jul 16, 1975   PHYSICAL THERAPY DISCHARGE SUMMARY  Visits from Start of Care: 9  Current functional level related to goals / functional outcomes: See above    Remaining deficits: Specifics unknown    Education / Equipment: HEP, self care, RICE , body mechanics  Plan: Patient agrees to discharge.  Patient goals were not met. Patient is being discharged due to the patient's request.  ?????    Raeford Razor, PT 11/29/18 5:40 PM Phone: [phone] Fax: [phone]

## 2018-06-03 ENCOUNTER — Ambulatory Visit: Payer: Medicaid Other

## 2018-06-10 ENCOUNTER — Ambulatory Visit: Payer: Medicaid Other | Admitting: Physical Therapy

## 2018-06-17 ENCOUNTER — Telehealth: Payer: Self-pay | Admitting: Physical Therapy

## 2018-06-17 NOTE — Telephone Encounter (Signed)
Krista Mcintosh was contacted today regarding the temporary reduction of OP Rehab Services due to concerns for community transmission of Covid-19.    Therapist advised the patient to continue to perform their HEP and assured they had no unanswered questions at this time.  The patient expressed interest in being contacted for an e-visit, virtual check in, or telehealth visit to continue their POC care, when those services become available.     Outpatient Rehabilitation Services will follow up with patients at that time.

## 2018-07-13 ENCOUNTER — Telehealth: Payer: Self-pay | Admitting: Physical Therapy

## 2018-07-13 ENCOUNTER — Encounter: Payer: Self-pay | Admitting: Physical Therapy

## 2018-07-13 NOTE — Telephone Encounter (Signed)
Krista Mcintosh was contacted today regarding temporary reduction of Outpatient Rehabilitation Services due to concerns for community transmission of COVID-19.  Patient identity was verified.  Assessed if patient needed to be seen in person by clinician (recent fall or acute injury that requires hands on assessment and advice, change in diet order, post-surgical, special cases, etc.).    The patient expressed interest in being contacted for a video visit to continue their plan of care when those services become available.  The patient verified that they have a video device and internet access for a telehealth visit.  This clinic will call to schedule patient for Telehealth visit as soon as possible.  Karie Mainland, PT 07/13/18 11:05 AM Phone: [phone] Fax: [phone]

## 2018-11-10 ENCOUNTER — Other Ambulatory Visit: Payer: Self-pay | Admitting: Emergency Medicine

## 2018-11-10 DIAGNOSIS — Z20822 Contact with and (suspected) exposure to covid-19: Secondary | ICD-10-CM

## 2018-11-12 LAB — NOVEL CORONAVIRUS, NAA: SARS-CoV-2, NAA: NOT DETECTED

## 2019-03-24 ENCOUNTER — Ambulatory Visit: Payer: Medicaid Other | Attending: Internal Medicine

## 2019-03-24 DIAGNOSIS — Z20822 Contact with and (suspected) exposure to covid-19: Secondary | ICD-10-CM

## 2019-03-25 LAB — NOVEL CORONAVIRUS, NAA: SARS-CoV-2, NAA: NOT DETECTED

## 2020-03-09 ENCOUNTER — Other Ambulatory Visit: Payer: Medicaid Other

## 2020-03-09 DIAGNOSIS — Z20822 Contact with and (suspected) exposure to covid-19: Secondary | ICD-10-CM

## 2020-03-13 LAB — NOVEL CORONAVIRUS, NAA: SARS-CoV-2, NAA: DETECTED — AB
